# Patient Record
Sex: Female | Born: 1950 | Race: Black or African American | Hispanic: No | Marital: Single | Smoking: Former smoker
Health system: Southern US, Community
[De-identification: ages and names within clinical notes are randomized; demographics above are authoritative.]

## PROBLEM LIST (undated history)

## (undated) DIAGNOSIS — E7800 Pure hypercholesterolemia, unspecified: Secondary | ICD-10-CM

## (undated) DIAGNOSIS — I10 Essential (primary) hypertension: Secondary | ICD-10-CM

## (undated) DIAGNOSIS — G43909 Migraine, unspecified, not intractable, without status migrainosus: Secondary | ICD-10-CM

## (undated) DIAGNOSIS — M1990 Unspecified osteoarthritis, unspecified site: Secondary | ICD-10-CM

---

## 2006-12-14 ENCOUNTER — Emergency Department: Payer: Self-pay | Admitting: Internal Medicine

## 2008-01-23 ENCOUNTER — Emergency Department: Payer: Self-pay | Admitting: Emergency Medicine

## 2008-05-28 ENCOUNTER — Ambulatory Visit: Payer: Self-pay | Admitting: Family Medicine

## 2008-07-18 ENCOUNTER — Emergency Department: Payer: Self-pay | Admitting: Emergency Medicine

## 2008-08-09 ENCOUNTER — Encounter: Payer: Self-pay | Admitting: Family Medicine

## 2008-08-29 ENCOUNTER — Encounter: Payer: Self-pay | Admitting: Family Medicine

## 2008-09-28 ENCOUNTER — Encounter: Payer: Self-pay | Admitting: Family Medicine

## 2009-11-18 ENCOUNTER — Ambulatory Visit: Payer: Self-pay | Admitting: Unknown Physician Specialty

## 2010-01-04 ENCOUNTER — Emergency Department: Payer: Self-pay | Admitting: Emergency Medicine

## 2011-07-05 ENCOUNTER — Emergency Department: Payer: Self-pay | Admitting: Internal Medicine

## 2011-07-27 ENCOUNTER — Ambulatory Visit: Payer: Self-pay

## 2012-07-01 ENCOUNTER — Ambulatory Visit: Payer: Self-pay | Admitting: Internal Medicine

## 2013-12-05 ENCOUNTER — Emergency Department: Payer: Self-pay | Admitting: Emergency Medicine

## 2017-05-19 ENCOUNTER — Emergency Department
Admission: EM | Admit: 2017-05-19 | Discharge: 2017-05-19 | Disposition: A | Discharge status: Home / Self Care | Payer: Managed Care, Other (non HMO) | Attending: Emergency Medicine | Admitting: Emergency Medicine

## 2017-05-19 ENCOUNTER — Encounter: Payer: Self-pay | Admitting: Emergency Medicine

## 2017-05-19 ENCOUNTER — Emergency Department: Payer: Managed Care, Other (non HMO)

## 2017-05-19 DIAGNOSIS — Z79899 Other long term (current) drug therapy: Secondary | ICD-10-CM | POA: Diagnosis not present

## 2017-05-19 DIAGNOSIS — R531 Weakness: Secondary | ICD-10-CM | POA: Diagnosis not present

## 2017-05-19 DIAGNOSIS — Z87891 Personal history of nicotine dependence: Secondary | ICD-10-CM | POA: Insufficient documentation

## 2017-05-19 DIAGNOSIS — R42 Dizziness and giddiness: Secondary | ICD-10-CM | POA: Diagnosis present

## 2017-05-19 DIAGNOSIS — I10 Essential (primary) hypertension: Secondary | ICD-10-CM | POA: Insufficient documentation

## 2017-05-19 HISTORY — DX: Unspecified osteoarthritis, unspecified site: M19.90

## 2017-05-19 HISTORY — DX: Essential (primary) hypertension: I10

## 2017-05-19 HISTORY — DX: Migraine, unspecified, not intractable, without status migrainosus: G43.909

## 2017-05-19 HISTORY — DX: Pure hypercholesterolemia, unspecified: E78.00

## 2017-05-19 LAB — URINALYSIS, COMPLETE (UACMP) WITH MICROSCOPIC
BILIRUBIN URINE: NEGATIVE
Glucose, UA: NEGATIVE mg/dL
Hgb urine dipstick: NEGATIVE
KETONES UR: NEGATIVE mg/dL
LEUKOCYTES UA: NEGATIVE
Nitrite: NEGATIVE
PROTEIN: NEGATIVE mg/dL
RBC / HPF: NONE SEEN RBC/hpf (ref 0–5)
Specific Gravity, Urine: 1.002 — ABNORMAL LOW (ref 1.005–1.030)
pH: 7 (ref 5.0–8.0)

## 2017-05-19 LAB — CBC
HEMATOCRIT: 38 % (ref 35.0–47.0)
HEMOGLOBIN: 13.1 g/dL (ref 12.0–16.0)
MCH: 27 pg (ref 26.0–34.0)
MCHC: 34.4 g/dL (ref 32.0–36.0)
MCV: 78.3 fL — AB (ref 80.0–100.0)
Platelets: 189 10*3/uL (ref 150–440)
RBC: 4.85 MIL/uL (ref 3.80–5.20)
RDW: 14.1 % (ref 11.5–14.5)
WBC: 6 10*3/uL (ref 3.6–11.0)

## 2017-05-19 LAB — BASIC METABOLIC PANEL
ANION GAP: 7 (ref 5–15)
BUN: 16 mg/dL (ref 6–20)
CHLORIDE: 102 mmol/L (ref 101–111)
CO2: 26 mmol/L (ref 22–32)
Calcium: 9.2 mg/dL (ref 8.9–10.3)
Creatinine, Ser: 0.82 mg/dL (ref 0.44–1.00)
GFR calc non Af Amer: >60 mL/min (ref >60)
Glucose, Bld: 129 mg/dL — ABNORMAL HIGH (ref 65–99)
POTASSIUM: 4.3 mmol/L (ref 3.5–5.1)
SODIUM: 135 mmol/L (ref 135–145)

## 2017-05-19 LAB — TROPONIN I: Troponin I: <0.03 ng/mL (ref <0.03)

## 2017-05-19 NOTE — ED Provider Notes (Signed)
[facility] Emergency Department Provider Note ____________________________________________   I have reviewed the triage vital signs and the triage nursing note.  HISTORY  Chief Complaint Weakness and Dizziness   Historian Patient  HPI Jacqueline Dalton is a 66 y.o. female with a history of hypertension, hyperlipidemia, and migraines, works as a Lawyer, states that on Friday after work she had onset of dizziness which she describes as lightheadedness and states that during the day she had been sweating while she was working although she was not out in the heat, she felt like she may have gotten overheated and dehydrated. She had a period about 10 minutes where she was feeling really lightheaded and felt like she was sweating and took it easy that evening.  Yesterday, Saturday, she felt like she just didn't have a much energy throughout the day.  This morning she was getting ready for work and started to feel like she might be a little lightheaded again. She states that she drank a lot of water and came over here for evaluation.  No shortness breath or trouble breathing. No fever. No abdominal pain. Mild nausea without vomiting. No diarrhea. No focal weakness or numbness. No slurred speech. No trouble word finding. No room spinning.    Past Medical History:  Diagnosis Date  . Arthritis   . Hypercholesteremia   . Hypertension   . Migraine     There are no active problems to display for this patient.   History reviewed. No pertinent surgical history.  Prior to Admission medications   Medication Sig Start Date End Date Taking? Authorizing Provider  amLODipine (NORVASC) 5 MG tablet Take 5 mg by mouth daily. 04/18/17  Yes [provider]  atorvastatin (LIPITOR) 20 MG tablet Take 20 mg by mouth daily. 05/06/17  Yes [provider]  butalbital-acetaminophen-caffeine (FIORICET, ESGIC) 50-325-40 MG tablet Take 1-2 tablets by mouth every 6 (six) hours as  needed for headache. 04/22/17  Yes [provider]  ibuprofen (ADVIL,MOTRIN) 800 MG tablet Take 800 mg by mouth every 6 (six) hours as needed for pain. Take 800 mg by mouth every 6-8 hours as needed for arthritis. 03/23/17  Yes [provider]  lovastatin (MEVACOR) 20 MG tablet Take 20 mg by mouth daily. 04/09/17  Yes [provider]  nabumetone (RELAFEN) 750 MG tablet Take 750 mg by mouth 2 (two) times daily. 05/13/17  Yes [provider]  omeprazole (PRILOSEC) 20 MG capsule Take 20 mg by mouth daily. 03/23/17  Yes [provider]  traMADol (ULTRAM) 50 MG tablet Take 50 mg by mouth every 6 (six) hours as needed for pain. 02/25/17  Yes [provider]    No Known Allergies  History reviewed. No pertinent family history.  Social History Social History  Substance Use Topics  . Smoking status: Former Games developer  . Smokeless tobacco: Never Used  . Alcohol use No    Review of Systems  Constitutional: Negative for fever. Eyes: Negative for visual changes. ENT: Negative for sore throat. Cardiovascular: Negative for chest pain. Respiratory: Negative for shortness of breath. Gastrointestinal: Negative for abdominal pain, vomiting and diarrhea. Genitourinary: Negative for dysuria. Musculoskeletal: Negative for back pain. Skin: Negative for rash. Neurological: Negative for headache.  ____________________________________________   PHYSICAL EXAM:  VITAL SIGNS: ED Triage Vitals  Enc Vitals Group     BP 05/19/17 0742 (!) 162/90     Pulse Rate 05/19/17 0742 92     Resp 05/19/17 0742 18  Temp 05/19/17 0742 98.4 F (36.9 C)     Temp Source 05/19/17 0742 Oral     SpO2 05/19/17 0742 98 %     Weight 05/19/17 0742 204 lb (92.5 kg)     Height 05/19/17 0742 5\' 4"  (1.626 m)     Head Circumference --      Peak Flow --      Pain Score 05/19/17 0745 3     Pain Loc --      Pain Edu? --      Excl. in GC? --      Constitutional: Alert and  oriented. Well appearing and in no distress. HEENT   Head: Normocephalic and atraumatic.      Eyes: Conjunctivae are normal. Pupils equal and round.       Ears:         Nose: No congestion/rhinnorhea.   Mouth/Throat: Mucous membranes are moist.   Neck: No stridor. Cardiovascular/Chest: Normal rate, regular rhythm.  No murmurs, rubs, or gallops. Respiratory: Normal respiratory effort without tachypnea nor retractions. Breath sounds are clear and equal bilaterally. No wheezes/rales/rhonchi. Gastrointestinal: Soft. No distention, no guarding, no rebound. Nontender.    Genitourinary/rectal:Deferred Musculoskeletal: Nontender with normal range of motion in all extremities. No joint effusions.  No lower extremity tenderness.  No edema. Neurologic:  No facial droop. No slurred speech.Normal speech and language. No gross or focal neurologic deficits are appreciated.  5 strength in 4 extremities. Coordination intact. Skin:  Skin is warm, dry and intact. No rash noted. Psychiatric: Mood and affect are normal. Speech and behavior are normal. Patient exhibits appropriate insight and judgment.   ____________________________________________  LABS (pertinent positives/negatives)  Labs Reviewed  BASIC METABOLIC PANEL - Abnormal; Notable for the following:       Result Value   Glucose, Bld 129 (*)    All other components within normal limits  CBC - Abnormal; Notable for the following:    MCV 78.3 (*)    All other components within normal limits  URINALYSIS, COMPLETE (UACMP) WITH MICROSCOPIC - Abnormal; Notable for the following:    Color, Urine COLORLESS (*)    APPearance CLEAR (*)    Specific Gravity, Urine 1.002 (*)    Bacteria, UA RARE (*)    Squamous Epithelial / LPF 0-5 (*)    All other components within normal limits  TROPONIN I    ____________________________________________    EKG I, Governor Rooksebecca Brelyn Woehl, MD, the attending physician have personally viewed and interpreted all  ECGs.  83 bpm. Normal sinus rhythm. Narrow QRS. Normal axis. Normal ST and T-wave ____________________________________________  RADIOLOGY All Xrays were viewed by me. Imaging interpreted by Radiologist.  CT head without contrast:  IMPRESSION: No acute intracranial findings. __________________________________________  PROCEDURES  Procedure(s) performed: None  Critical Care performed: None  ____________________________________________   ED COURSE / ASSESSMENT AND PLAN  Pertinent labs & imaging results that were available during my care of the patient were reviewed by me and considered in my medical decision making (see chart for details).   Jacqueline Dalton, the complaint of an episode of dizziness associated with some sweating, possibly exertional on Friday, followed by 24 hours of significant fatigue, but without any focal neurologic deficit, and then this morning felt like she might have persistent lightheaded/dizziness.  Currently she is overall well-appearing. She is not reporting focal neurologic symptoms and I think TIA or stroke seems unlikely. However given the fact that she has not had episodes like this with dizziness, I am going  to obtain head CT. Symptom onset at this point was to half days ago.  Denies any chest pain or shortness of breath, I think although she had exertional sweating, that is not ongoing, and her EKG is reassuring, I'm less suspicious for ACS.  However given some exertional component, and persistent fatigue, I am going to recommend she follows up with cardiology.    CONSULTATIONS:   none  Patient / Family / Caregiver informed of clinical course, medical decision-making process, and agree with plan.   I discussed return precautions, follow-up instructions, and discharge instructions with patient and/or family.  Discharge Instructions :  You were evaluated for dizziness and fatigue, and although no certain cause was found, your exam and evaluation are  overall reassuring in the emergency room today.  Return to the emergency room immediately for any worsening symptoms including weakness, numbness, slurred speech, trouble finding her words, dizziness, passing out, altered mental status, or any other symptoms concerning to you.  ___________________________________________   FINAL CLINICAL IMPRESSION(S) / ED DIAGNOSES   Final diagnoses:  Dizziness  Generalized weakness  Lightheadedness              Note: This dictation was prepared with Dragon dictation. Any transcriptional errors that result from this process are unintentional    Governor Rooks, MD 05/19/17 1242

## 2017-05-19 NOTE — ED Triage Notes (Signed)
Pt states Friday night after she got off work she was dizzy while getting in to the shower. Pt states she had chest tightness and weakness in her legs on Friday.  Pt felt the same sxs yesterday but states it wasn't as bad as before. Pt states she has been having intermittent headaches for the past several weeks. Pt states this morning she was getting ready for work and felt lightheaded again.  Pt is ambulatory in triage without difficulty.

## 2017-05-19 NOTE — Discharge Instructions (Signed)
You were evaluated for dizziness and fatigue, and although no certain cause was found, your exam and evaluation are overall reassuring in the emergency room today.  Return to the emergency room immediately for any worsening symptoms including weakness, numbness, slurred speech, trouble finding her words, dizziness, passing out, altered mental status, or any other symptoms concerning to you.

## 2017-05-23 DIAGNOSIS — I10 Essential (primary) hypertension: Secondary | ICD-10-CM | POA: Insufficient documentation

## 2017-05-23 DIAGNOSIS — E7800 Pure hypercholesterolemia, unspecified: Secondary | ICD-10-CM | POA: Insufficient documentation

## 2017-09-27 ENCOUNTER — Emergency Department
Admission: EM | Admit: 2017-09-27 | Discharge: 2017-09-27 | Disposition: A | Discharge status: Home / Self Care | Payer: Medicaid Other | Attending: Emergency Medicine | Admitting: Emergency Medicine

## 2017-09-27 ENCOUNTER — Encounter: Payer: Self-pay | Admitting: Emergency Medicine

## 2017-09-27 ENCOUNTER — Emergency Department: Payer: Medicaid Other

## 2017-09-27 DIAGNOSIS — S9032XA Contusion of left foot, initial encounter: Secondary | ICD-10-CM | POA: Diagnosis not present

## 2017-09-27 DIAGNOSIS — Z79899 Other long term (current) drug therapy: Secondary | ICD-10-CM | POA: Insufficient documentation

## 2017-09-27 DIAGNOSIS — Z87891 Personal history of nicotine dependence: Secondary | ICD-10-CM | POA: Insufficient documentation

## 2017-09-27 DIAGNOSIS — Y999 Unspecified external cause status: Secondary | ICD-10-CM | POA: Diagnosis not present

## 2017-09-27 DIAGNOSIS — I10 Essential (primary) hypertension: Secondary | ICD-10-CM | POA: Insufficient documentation

## 2017-09-27 DIAGNOSIS — W208XXA Other cause of strike by thrown, projected or falling object, initial encounter: Secondary | ICD-10-CM | POA: Insufficient documentation

## 2017-09-27 DIAGNOSIS — Y929 Unspecified place or not applicable: Secondary | ICD-10-CM | POA: Diagnosis not present

## 2017-09-27 DIAGNOSIS — S99922A Unspecified injury of left foot, initial encounter: Secondary | ICD-10-CM | POA: Diagnosis present

## 2017-09-27 DIAGNOSIS — Y939 Activity, unspecified: Secondary | ICD-10-CM | POA: Diagnosis not present

## 2017-09-27 LAB — BASIC METABOLIC PANEL
ANION GAP: 10 (ref 5–15)
BUN: 15 mg/dL (ref 6–20)
CO2: 23 mmol/L (ref 22–32)
Calcium: 9.5 mg/dL (ref 8.9–10.3)
Chloride: 104 mmol/L (ref 101–111)
Creatinine, Ser: 0.78 mg/dL (ref 0.44–1.00)
GFR calc Af Amer: >60 mL/min (ref >60)
Glucose, Bld: 146 mg/dL — ABNORMAL HIGH (ref 65–99)
POTASSIUM: 4.3 mmol/L (ref 3.5–5.1)
SODIUM: 137 mmol/L (ref 135–145)

## 2017-09-27 LAB — CBC WITH DIFFERENTIAL/PLATELET
BASOS ABS: 0.1 10*3/uL (ref 0–0.1)
Basophils Relative: 2 %
EOS ABS: 0.2 10*3/uL (ref 0–0.7)
EOS PCT: 3 %
HCT: 38.4 % (ref 35.0–47.0)
Hemoglobin: 13.1 g/dL (ref 12.0–16.0)
LYMPHS PCT: 41 %
Lymphs Abs: 2.5 10*3/uL (ref 1.0–3.6)
MCH: 26.8 pg (ref 26.0–34.0)
MCHC: 34.1 g/dL (ref 32.0–36.0)
MCV: 78.6 fL — ABNORMAL LOW (ref 80.0–100.0)
Monocytes Absolute: 0.5 10*3/uL (ref 0.2–0.9)
Monocytes Relative: 8 %
Neutro Abs: 2.9 10*3/uL (ref 1.4–6.5)
Neutrophils Relative %: 46 %
PLATELETS: 207 10*3/uL (ref 150–440)
RBC: 4.88 MIL/uL (ref 3.80–5.20)
RDW: 13.6 % (ref 11.5–14.5)
WBC: 6.2 10*3/uL (ref 3.6–11.0)

## 2017-09-27 LAB — URIC ACID: URIC ACID, SERUM: 5.8 mg/dL (ref 2.3–6.6)

## 2017-09-27 MED ORDER — NAPROXEN 500 MG PO TABS: 500.0000 mg | ORAL_TABLET | Freq: Two times a day (BID) | ORAL | Status: AC

## 2017-09-27 MED ORDER — NAPROXEN 500 MG PO TABS
500.0000 mg | ORAL_TABLET | Freq: Once | ORAL | Status: AC
  Administered 2017-09-27: 500 mg via ORAL
  Filled 2017-09-27: qty 1

## 2017-09-27 NOTE — Discharge Instructions (Addendum)
Wear ace wrap and open shoe for 7-10 days.

## 2017-09-27 NOTE — ED Provider Notes (Signed)
[facility] Emergency Department Provider Note   ____________________________________________   First MD Initiated Contact with Patient 09/27/17 1022     (approximate)  I have reviewed the triage vital signs and the nursing notes.   HISTORY  Chief Complaint Foot Pain    HPI Jacqueline Dalton is a 66 y.o. female patient complaining the left foot pain and edema since 07/29/2017. Patient states she dropped a glass bottle on her foot and has not improved since that incident. Patient state taking over-the-counter Tylenol and ibuprofen.Patient rates the pain as a 6/10. Patient described a pain as "achy". Patient  able to bear weight.   Past Medical History:  Diagnosis Date  . Arthritis   . Hypercholesteremia   . Hypertension   . Migraine     There are no active problems to display for this patient.   History reviewed. No pertinent surgical history.  Prior to Admission medications   Medication Sig Start Date End Date Taking? Authorizing Provider  amLODipine (NORVASC) 5 MG tablet Take 5 mg by mouth daily. 04/18/17   [provider]  atorvastatin (LIPITOR) 20 MG tablet Take 20 mg by mouth daily. 05/06/17   [provider]  butalbital-acetaminophen-caffeine (FIORICET, ESGIC) 50-325-40 MG tablet Take 1-2 tablets by mouth every 6 (six) hours as needed for headache. 04/22/17   [provider]  ibuprofen (ADVIL,MOTRIN) 800 MG tablet Take 800 mg by mouth every 6 (six) hours as needed for pain. Take 800 mg by mouth every 6-8 hours as needed for arthritis. 03/23/17   [provider]  lovastatin (MEVACOR) 20 MG tablet Take 20 mg by mouth daily. 04/09/17   [provider]  nabumetone (RELAFEN) 750 MG tablet Take 750 mg by mouth 2 (two) times daily. 05/13/17   [provider]  naproxen (NAPROSYN) 500 MG tablet Take 1 tablet (500 mg total) by mouth 2 (two) times daily with a meal. 09/27/17   Joni ReiningSmith, Ronald K, PA-C  omeprazole  (PRILOSEC) 20 MG capsule Take 20 mg by mouth daily. 03/23/17   [provider]  traMADol (ULTRAM) 50 MG tablet Take 50 mg by mouth every 6 (six) hours as needed for pain. 02/25/17   [provider]    Allergies Patient has no known allergies.  No family history on file.  Social History Social History   Tobacco Use  . Smoking status: Former Games developermoker  . Smokeless tobacco: Never Used  Substance Use Topics  . Alcohol use: No  . Drug use: Not on file    Review of Systems Constitutional: No fever/chills Eyes: No visual changes. ENT: No sore throat. Cardiovascular: Denies chest pain. Respiratory: Denies shortness of breath. Gastrointestinal: No abdominal pain.  No nausea, no vomiting.  No diarrhea.  No constipation. Genitourinary: Negative for dysuria. Musculoskeletal: Negative for back pain. Skin: Negative for rash. Neurological: Negative for headaches, focal weakness or numbness. Endocrine:Hyperlipidemia, hypertension, prediabetic.  ____________________________________________   PHYSICAL EXAM:  VITAL SIGNS: ED Triage Vitals  Enc Vitals Group     BP 09/27/17 0947 (!) 168/91     Pulse Rate 09/27/17 0947 87     Resp 09/27/17 0947 16     Temp 09/27/17 0947 98.3 F (36.8 C)     Temp Source 09/27/17 0947 Oral     SpO2 09/27/17 0947 98 %     Weight 09/27/17 0941 200 lb (90.7 kg)     Height 09/27/17 0941 5\' 2"  (1.575 m)     Head Circumference --  Peak Flow --      Pain Score 09/27/17 0940 6     Pain Loc --      Pain Edu? --      Excl. in GC? --     Constitutional: Alert and oriented. Well appearing and in no acute distress. Hematological/Lymphatic/Immunilogical: No cervical lymphadenopathy. Cardiovascular: Normal rate, regular rhythm. Grossly normal heart sounds.  Good peripheral circulation. Respiratory: Normal respiratory effort.  No retractions. Lungs CTAB. Gastrointestinal: Soft and nontender. No distention. No abdominal bruits. No CVA  tenderness. Musculoskeletal: No lower extremity tenderness nor edema.  No joint effusions. Neurologic:  Normal speech and language. No gross focal neurologic deficits are appreciated. No gait instability. Skin:  Skin is warm, dry and intact. No rash noted. Psychiatric: Mood and affect are normal. Speech and behavior are normal.  ____________________________________________   LABS (all labs ordered are listed, but only abnormal results are displayed)  Labs Reviewed  CBC WITH DIFFERENTIAL/PLATELET - Abnormal; Notable for the following components:      Result Value   MCV 78.6 (*)    All other components within normal limits  BASIC METABOLIC PANEL - Abnormal; Notable for the following components:   Glucose, Bld 146 (*)    All other components within normal limits  URIC ACID   ____________________________________________  EKG  ____________________________________________  RADIOLOGY  Dg Foot Complete Left  Result Date: 09/27/2017 CLINICAL DATA:  Left foot pain on dorsal surface since dropping glass bottle on foot x2 months ago. Hx of arthritis. No previous surgery to left foot. EXAM: LEFT FOOT - COMPLETE 3+ VIEW COMPARISON:  None. FINDINGS: There is no evidence of fracture or dislocation. There is no evidence of arthropathy or other focal bone abnormality. Soft tissue swelling along the dorsal forefoot. IMPRESSION: No acute osseous injury of the left foot. Electronically Signed   By: Elige KoHetal  Patel   On: 09/27/2017 10:13    ____________________________________________   PROCEDURES  Procedure(s) performed: None  Procedures  Critical Care performed: No  ____________________________________________   INITIAL IMPRESSION / ASSESSMENT AND PLAN / ED COURSE  As part of my medical decision making, I reviewed the following data within the electronic MEDICAL RECORD NUMBER    (Pain secondary to contusion. Discussed negative labs and x-ray results with patient. Patient given discharge  Instruction. Patient put was a graft and she was given open shoe. Patient advised follow-up with PCP in one week for reevaluation.      ____________________________________________   FINAL CLINICAL IMPRESSION(S) / ED DIAGNOSES  Final diagnoses:  Contusion of left foot, initial encounter     ED Discharge Orders        Ordered    naproxen (NAPROSYN) 500 MG tablet  2 times daily with meals     09/27/17 1121       Note:  This document was prepared using Dragon voice recognition software and may include unintentional dictation errors.    Joni ReiningSmith, Ronald K, PA-C 09/27/17 1124    Sharyn CreamerQuale, Mark, MD 09/27/17 [phone]

## 2017-09-27 NOTE — ED Triage Notes (Signed)
Patient presents to the ED with left foot pain since Oct. 1st.  Patient states pain is to the top of her foot and began when she accidentally dropped a glass bottle on it.  Patient states, "I've been waiting for it to get better, but it hasn't."

## 2017-10-28 ENCOUNTER — Emergency Department
Admission: EM | Admit: 2017-10-28 | Discharge: 2017-10-28 | Disposition: A | Discharge status: Home / Self Care | Payer: Medicaid Other | Attending: Student in an Organized Health Care Education/Training Program | Admitting: Student in an Organized Health Care Education/Training Program

## 2017-10-28 ENCOUNTER — Encounter: Payer: Self-pay | Admitting: Emergency Medicine

## 2017-10-28 ENCOUNTER — Other Ambulatory Visit: Payer: Self-pay

## 2017-10-28 ENCOUNTER — Emergency Department: Payer: Medicaid Other

## 2017-10-28 DIAGNOSIS — Z87891 Personal history of nicotine dependence: Secondary | ICD-10-CM | POA: Diagnosis not present

## 2017-10-28 DIAGNOSIS — Z79899 Other long term (current) drug therapy: Secondary | ICD-10-CM | POA: Insufficient documentation

## 2017-10-28 DIAGNOSIS — Z791 Long term (current) use of non-steroidal anti-inflammatories (NSAID): Secondary | ICD-10-CM | POA: Diagnosis not present

## 2017-10-28 DIAGNOSIS — R0981 Nasal congestion: Secondary | ICD-10-CM | POA: Diagnosis not present

## 2017-10-28 DIAGNOSIS — I10 Essential (primary) hypertension: Secondary | ICD-10-CM | POA: Diagnosis not present

## 2017-10-28 DIAGNOSIS — R05 Cough: Secondary | ICD-10-CM | POA: Diagnosis present

## 2017-10-28 DIAGNOSIS — J40 Bronchitis, not specified as acute or chronic: Secondary | ICD-10-CM | POA: Diagnosis not present

## 2017-10-28 LAB — COMPREHENSIVE METABOLIC PANEL
ALBUMIN: 4.1 g/dL (ref 3.5–5.0)
ALT: 40 U/L (ref 14–54)
AST: 39 U/L (ref 15–41)
Alkaline Phosphatase: 175 U/L — ABNORMAL HIGH (ref 38–126)
Anion gap: 11 (ref 5–15)
BUN: 13 mg/dL (ref 6–20)
CHLORIDE: 101 mmol/L (ref 101–111)
CO2: 25 mmol/L (ref 22–32)
CREATININE: 0.75 mg/dL (ref 0.44–1.00)
Calcium: 9.5 mg/dL (ref 8.9–10.3)
GFR calc Af Amer: >60 mL/min (ref >60)
GFR calc non Af Amer: >60 mL/min (ref >60)
GLUCOSE: 122 mg/dL — AB (ref 65–99)
POTASSIUM: 4 mmol/L (ref 3.5–5.1)
SODIUM: 137 mmol/L (ref 135–145)
Total Bilirubin: 0.6 mg/dL (ref 0.3–1.2)
Total Protein: 7.6 g/dL (ref 6.5–8.1)

## 2017-10-28 LAB — CBC WITH DIFFERENTIAL/PLATELET
Basophils Absolute: 0.1 10*3/uL (ref 0–0.1)
Basophils Relative: 1 %
EOS ABS: 0.1 10*3/uL (ref 0–0.7)
EOS PCT: 2 %
HCT: 38 % (ref 35.0–47.0)
Hemoglobin: 13.1 g/dL (ref 12.0–16.0)
LYMPHS ABS: 0.8 10*3/uL — AB (ref 1.0–3.6)
LYMPHS PCT: 10 %
MCH: 26.9 pg (ref 26.0–34.0)
MCHC: 34.5 g/dL (ref 32.0–36.0)
MCV: 78 fL — AB (ref 80.0–100.0)
MONOS PCT: 8 %
Monocytes Absolute: 0.7 10*3/uL (ref 0.2–0.9)
Neutro Abs: 6.6 10*3/uL — ABNORMAL HIGH (ref 1.4–6.5)
Neutrophils Relative %: 79 %
PLATELETS: 185 10*3/uL (ref 150–440)
RBC: 4.87 MIL/uL (ref 3.80–5.20)
RDW: 13.6 % (ref 11.5–14.5)
WBC: 8.3 10*3/uL (ref 3.6–11.0)

## 2017-10-28 LAB — TROPONIN I

## 2017-10-28 MED ORDER — SULFAMETHOXAZOLE-TRIMETHOPRIM 800-160 MG PO TABS: 1.0000 | ORAL_TABLET | Freq: Two times a day (BID) | ORAL | 0 refills | Status: AC

## 2017-10-28 MED ORDER — FEXOFENADINE-PSEUDOEPHED ER 60-120 MG PO TB12: 1.0000 | ORAL_TABLET | Freq: Two times a day (BID) | ORAL | 0 refills | Status: AC

## 2017-10-28 MED ORDER — BENZONATATE 100 MG PO CAPS
200.0000 mg | ORAL_CAPSULE | Freq: Three times a day (TID) | ORAL | 0 refills | Status: AC | PRN
Start: 2017-10-28 — End: 2018-10-28

## 2017-10-28 NOTE — ED Provider Notes (Signed)
[facility] Emergency Department Provider Note   ____________________________________________   First MD Initiated Contact with Patient 10/28/17 1220     (approximate)  I have reviewed the triage vital signs and the nursing notes.   HISTORY  Chief Complaint Cough and Generalized Body Aches    HPI Jacqueline Dalton is a 66 y.o. female patient presents with complaining of runny nose for 2 weeks. States the last 2 days onset of  cough and today she is having generalized body aches. Patient denies fever. Patient denies shortness of breath. Patient states chest wall pain which increased with cough. Patient has taken a flu immunizations this season. Patient was found to be tachycardic in triage.No palliative measures for complaint.   Past Medical History:  Diagnosis Date  . Arthritis   . Hypercholesteremia   . Hypertension   . Migraine     There are no active problems to display for this patient.   History reviewed. No pertinent surgical history.  Prior to Admission medications   Medication Sig Start Date End Date Taking? Authorizing Provider  amLODipine (NORVASC) 5 MG tablet Take 5 mg by mouth daily. 04/18/17   [provider]  atorvastatin (LIPITOR) 20 MG tablet Take 20 mg by mouth daily. 05/06/17   [provider]  benzonatate (TESSALON PERLES) 100 MG capsule Take 2 capsules (200 mg total) by mouth 3 (three) times daily as needed. 10/28/17 10/28/18  Joni Reining, PA-C  butalbital-acetaminophen-caffeine (FIORICET, ESGIC) [phone] MG tablet Take 1-2 tablets by mouth every 6 (six) hours as needed for headache. 04/22/17   [provider]  fexofenadine-pseudoephedrine (ALLEGRA-D) 60-120 MG 12 hr tablet Take 1 tablet by mouth 2 (two) times daily. 10/28/17   Joni Reining, PA-C  ibuprofen (ADVIL,MOTRIN) 800 MG tablet Take 800 mg by mouth every 6 (six) hours as needed for pain. Take 800 mg by mouth every 6-8 hours as needed for  arthritis. 03/23/17   [provider]  lovastatin (MEVACOR) 20 MG tablet Take 20 mg by mouth daily. 04/09/17   [provider]  nabumetone (RELAFEN) 750 MG tablet Take 750 mg by mouth 2 (two) times daily. 05/13/17   [provider]  naproxen (NAPROSYN) 500 MG tablet Take 1 tablet (500 mg total) by mouth 2 (two) times daily with a meal. 09/27/17   Joni Reining, PA-C  omeprazole (PRILOSEC) 20 MG capsule Take 20 mg by mouth daily. 03/23/17   [provider]  sulfamethoxazole-trimethoprim (BACTRIM DS,SEPTRA DS) 800-160 MG tablet Take 1 tablet by mouth 2 (two) times daily. 10/28/17   Joni Reining, PA-C  traMADol (ULTRAM) 50 MG tablet Take 50 mg by mouth every 6 (six) hours as needed for pain. 02/25/17   [provider]    Allergies Patient has no known allergies.  History reviewed. No pertinent family history.  Social History Social History   Tobacco Use  . Smoking status: Former Games developer  . Smokeless tobacco: Never Used  Substance Use Topics  . Alcohol use: No  . Drug use: Not on file    Review of Systems Constitutional: No fever/chills Eyes: No visual changes. ENT: Intermittent nasal congestion and runny nose Cardiovascular: Denies chest pain. Respiratory: Denies shortness of breath. Nonproductive cough Gastrointestinal: No abdominal pain.  No nausea, no vomiting.  No diarrhea.  No constipation. Genitourinary: Negative for dysuria. Musculoskeletal: Anterior chest wall pain Skin: Negative for rash. Neurological: Negative for headaches, focal weakness or numbness.   ____________________________________________   PHYSICAL EXAM:  VITAL SIGNS: ED Triage Vitals  Enc Vitals Group     BP 10/28/17 1158 (!) 148/114     Pulse Rate 10/28/17 1158 (!) 120     Resp 10/28/17 1158 18     Temp 10/28/17 1158 99.4 F (37.4 C)     Temp Source 10/28/17 1158 Oral     SpO2 10/28/17 1158 97 %     Weight 10/28/17 1200 209 lb (94.8 kg)     Height  10/28/17 1200 5\' 3"  (1.6 m)     Head Circumference --      Peak Flow --      Pain Score 10/28/17 1205 10     Pain Loc --      Pain Edu? --      Excl. in GC? --    Constitutional: Alert and oriented. Well appearing and in no acute distress. Nose: Clear rhinorrhea  Mouth/Throat: Mucous membranes are moist.  Oropharynx non-erythematous. Neck: No stridor.  No cervical lymphadenopathy. Cardiovascular: Tachycardic, regular rhythm. Grossly normal heart sounds.  Good peripheral circulation. Respiratory: Normal respiratory effort.  No retractions. Lungs CTAB. Neurologic:  Normal speech and language. No gross focal neurologic deficits are appreciated. No gait instability. Skin:  Skin is warm, dry and intact. No rash noted. Psychiatric: Mood and affect are normal. Speech and behavior are normal.  ____________________________________________   LABS (all labs ordered are listed, but only abnormal results are displayed)  Labs Reviewed  COMPREHENSIVE METABOLIC PANEL - Abnormal; Notable for the following components:      Result Value   Glucose, Bld 122 (*)    Alkaline Phosphatase 175 (*)    All other components within normal limits  CBC WITH DIFFERENTIAL/PLATELET - Abnormal; Notable for the following components:   MCV 78.0 (*)    Neutro Abs 6.6 (*)    Lymphs Abs 0.8 (*)    All other components within normal limits  TROPONIN I   ____________________________________________  EKG   ____________________________________________  RADIOLOGY  Dg Chest 2 View  Result Date: 10/28/2017 CLINICAL DATA:  Mid chest pain and body aches began this morning. History of hypertension, hyperlipidemia, former smoker. EXAM: CHEST  2 VIEW COMPARISON:  None in PACs FINDINGS: The lungs are adequately inflated. The interstitial markings are coarse. There is no alveolar infiltrate or pleural effusion. The heart and pulmonary vascularity are normal. The mediastinum is normal in width. The bony thorax exhibits no  acute abnormality. IMPRESSION: Mild interstitial prominence of both lungs may be chronic and related to previous smoking but could also reflect acute bronchitic change. No alveolar pneumonia nor CHF. Electronically Signed   By: David  Swazil[place]Jordan M.D.   On: 10/28/2017 12:43    ____________________________________________   PROCEDURES  Procedure(s) performed: None  Procedures  Critical Care performed: No  ____________________________________________   INITIAL IMPRESSION / ASSESSMENT AND PLAN / ED COURSE  As part of my medical decision making, I reviewed the following data within the electronic MEDICAL RECORD NUMBER    Cough secondary to bronchitis. Discuss x-ray and lab results patient. Patient given discharge Instructions advised to take medications as directed. Patient advised follow-up PCP if condition persists. Patient given a work note for today.      ____________________________________________   FINAL CLINICAL IMPRESSION(S) / ED DIAGNOSES  Final diagnoses:  Bronchitis  Nasal congestion     ED Discharge Orders        Ordered    fexofenadine-pseudoephedrine (ALLEGRA-D) 60-120 MG 12 hr tablet  2 times daily  10/28/17 1259    sulfamethoxazole-trimethoprim (BACTRIM DS,SEPTRA DS) 800-160 MG tablet  2 times daily     10/28/17 1259    benzonatate (TESSALON PERLES) 100 MG capsule  3 times daily PRN     10/28/17 1259       Note:  This document was prepared using Dragon voice recognition software and may include unintentional dictation errors.    Joni ReiningSmith, Mckenze Slone K, PA-C 10/28/17 1302    Willy Eddyobinson, Patrick, MD 10/28/17 [phone]

## 2017-10-28 NOTE — ED Notes (Signed)
Pt c/o runny nose x 2 weeks, states yesterday began having generalized body aches, chest pain with cough. Pt denies SHOB at this time, pt denies productive cough at this time.

## 2017-10-28 NOTE — ED Triage Notes (Signed)
Started with runny nose X 2 weeks and 2 days ago began with cough and generalized body aches. No fevers. Denies SHOB. Chest wall pain with cough only.  Ambulatory. No respiratory distress. ST in triage, EKG done for this.

## 2018-01-29 IMAGING — CT CT HEAD W/O CM
3 series · 15 of 44 positions shown, 18 images · non-contrast
Comparison: None.

CLINICAL DATA: Episode of dizziness with chest tightness and
weakness 2 days ago in again yesterday. Headaches past several
weeks.

EXAM:
CT HEAD WITHOUT CONTRAST
TECHNIQUE: Contiguous axial images were obtained from the base of the skull
through the vertex without intravenous contrast.

[Series 2: head wo · axial · 0.40mm/px · z∈[-99,+11]mm · 9 of 27 slices shown, 12 images]
[im 3/27  brain]
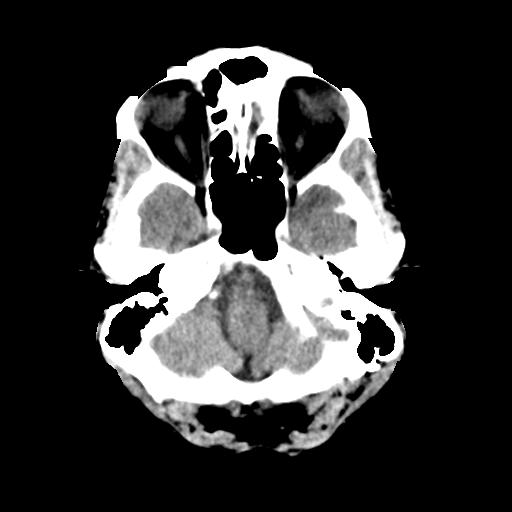
[im 3/27  bone]
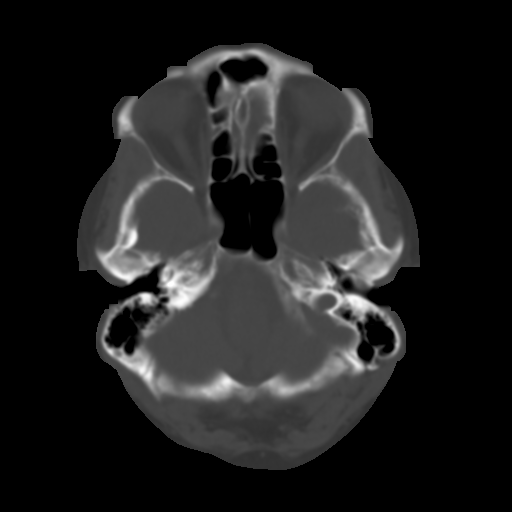
[im 6/27  brain]
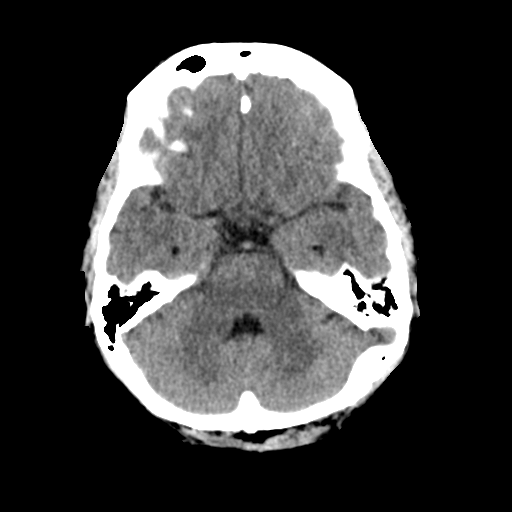
[im 8/27  brain]
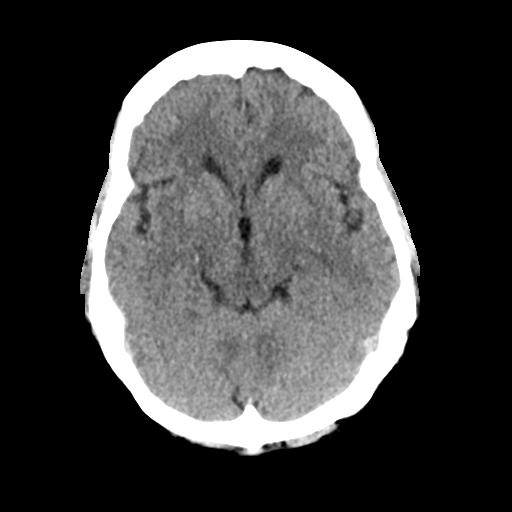
[im 11/27  brain]
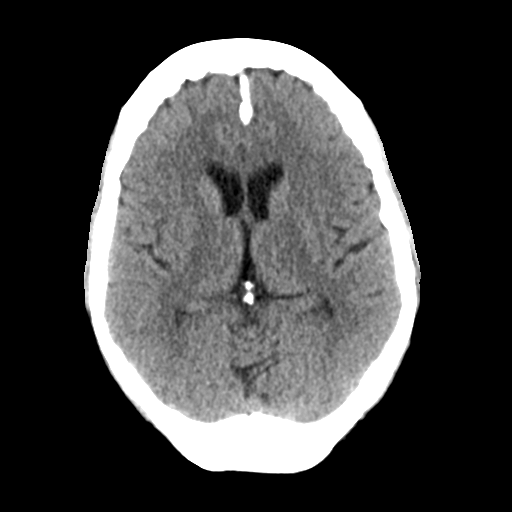
[im 14/27  brain]
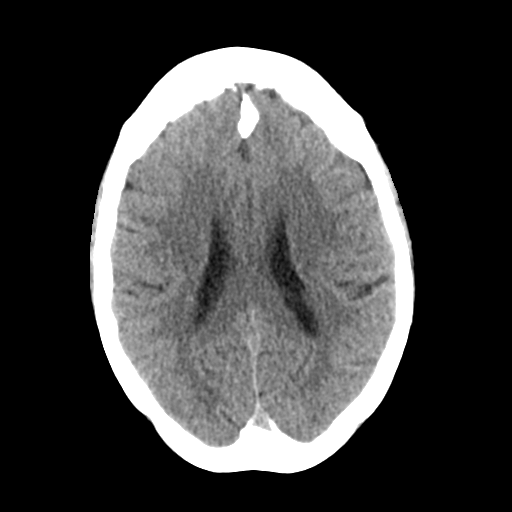
[im 14/27  bone]
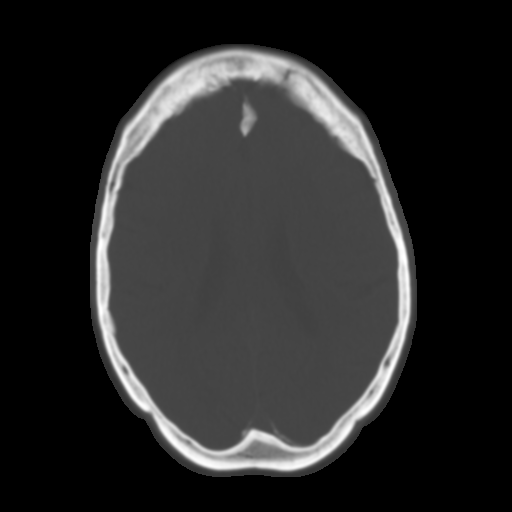
[im 17/27  brain]
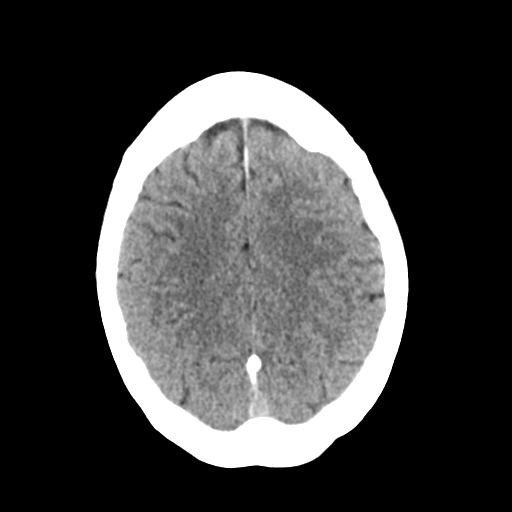
[im 20/27  brain]
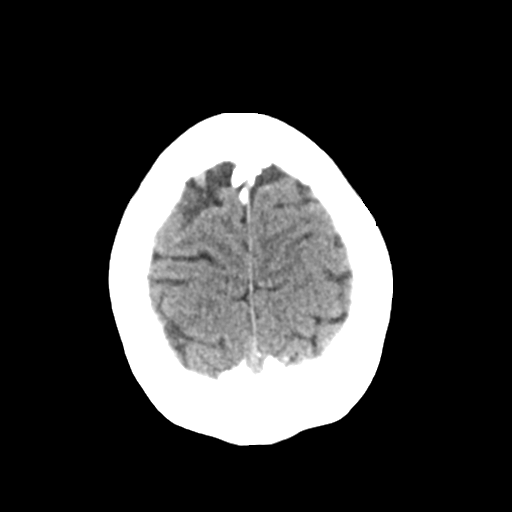
[im 22/27  brain]
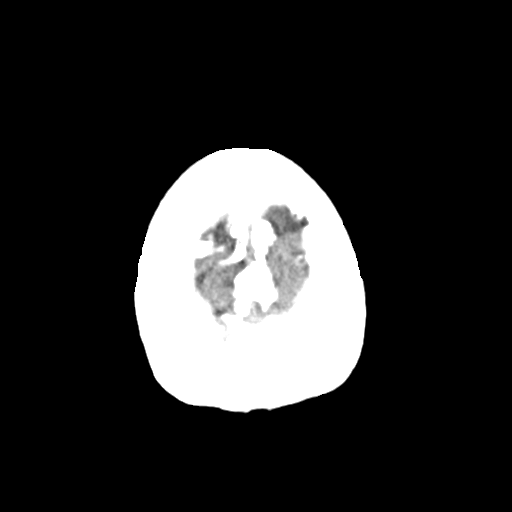
[im 25/27  brain]
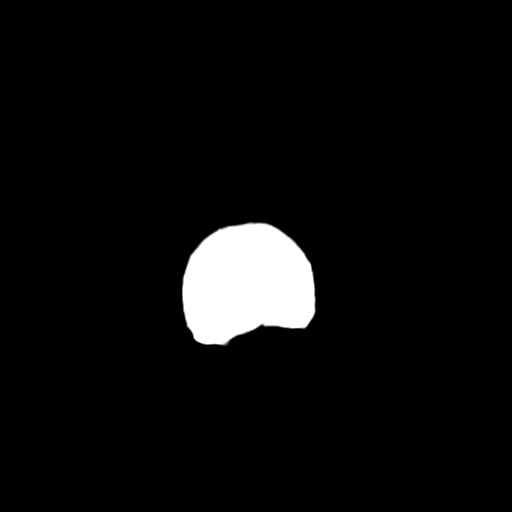
[im 25/27  bone]
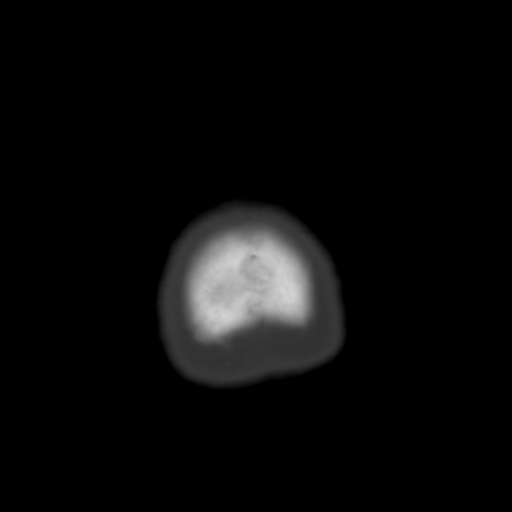

[Series 4: coronal soft tissue · coronal · 0.29mm/px · 3 of 61 slices shown]
[im 21/61  brain]
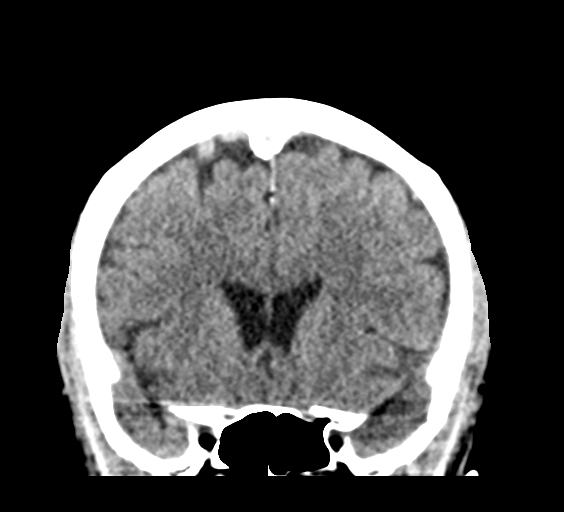
[im 27/61  brain]
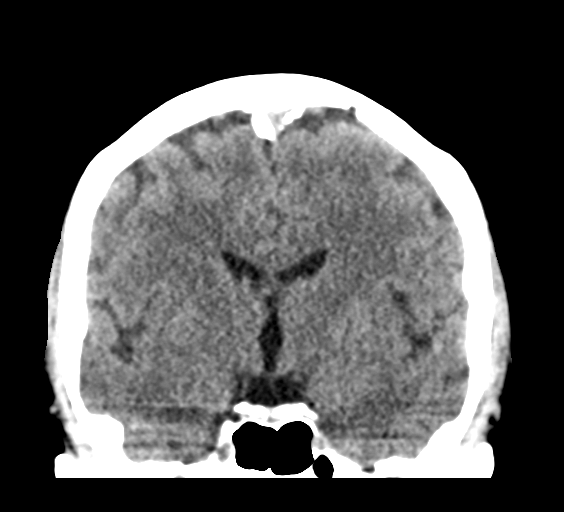
[im 34/61  brain]
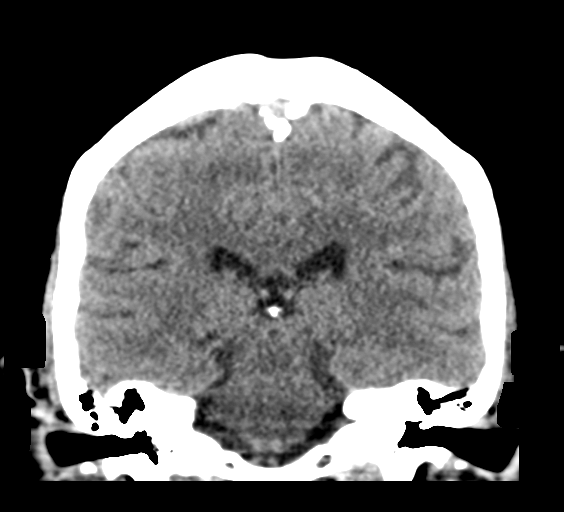

[Series 5: sagittal soft tissue · sagittal · 0.27mm/px · 3 of 48 slices shown]
[im 16/48  brain]
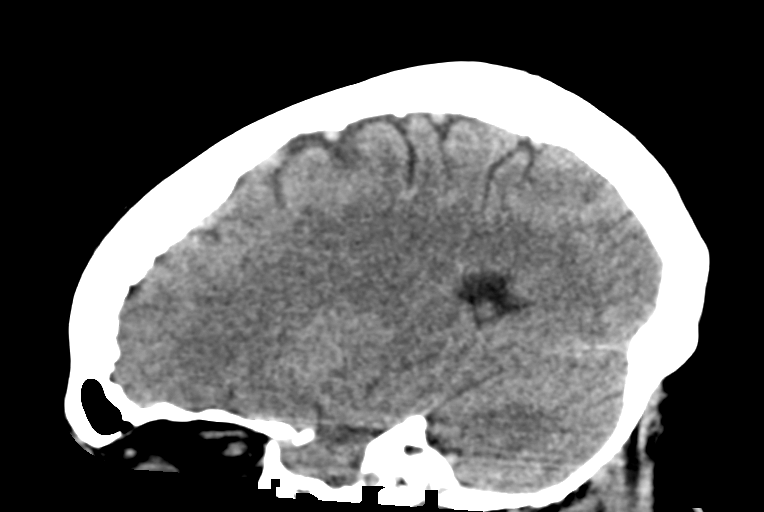
[im 24/48  brain]
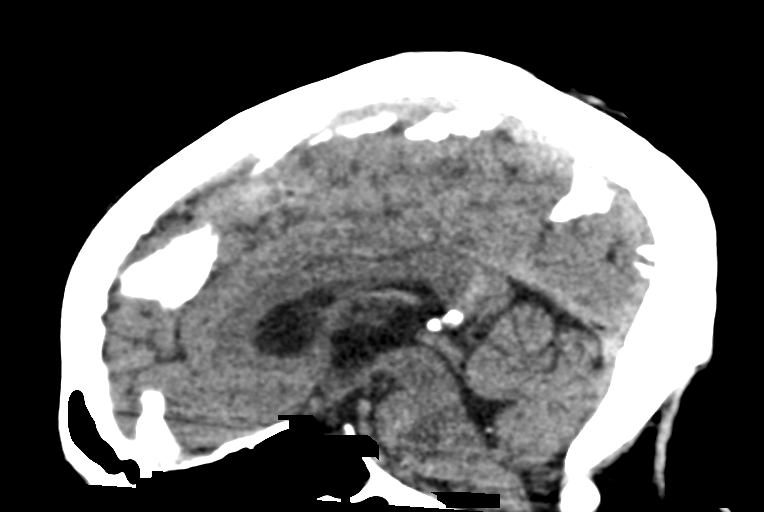
[im 32/48  brain]
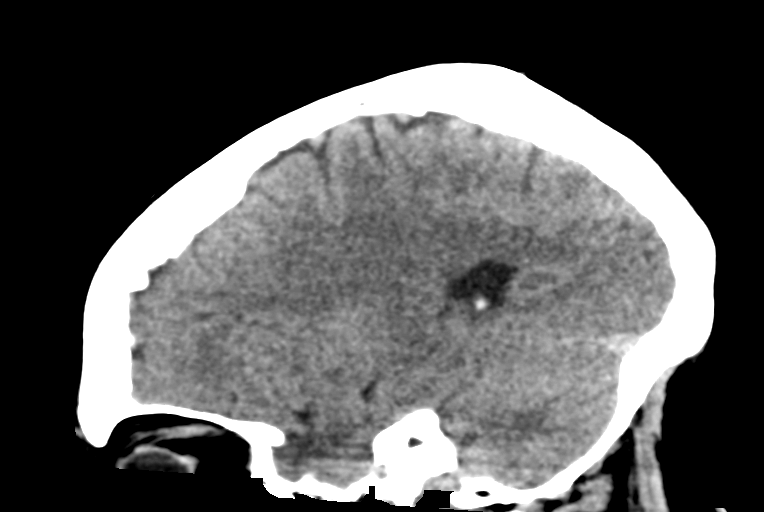

[15 of 44 positions shown; findings below may reference images not displayed]

FINDINGS: Brain: Ventricles, cisterns and other CSF spaces are normal. There
is no mass, mass effect, shift of midline structures or acute
hemorrhage. No evidence of acute infarction.

Vascular: No hyperdense vessel or unexpected calcification.

Skull: Normal. Negative for fracture or focal lesion.

Sinuses/Orbits: No acute finding.

Other: None.
IMPRESSION: No acute intracranial findings.

## 2018-02-12 DIAGNOSIS — G8929 Other chronic pain: Secondary | ICD-10-CM | POA: Insufficient documentation

## 2018-02-12 DIAGNOSIS — M79672 Pain in left foot: Secondary | ICD-10-CM | POA: Insufficient documentation

## 2018-02-17 DIAGNOSIS — E1169 Type 2 diabetes mellitus with other specified complication: Secondary | ICD-10-CM | POA: Insufficient documentation

## 2018-02-17 DIAGNOSIS — E785 Hyperlipidemia, unspecified: Secondary | ICD-10-CM | POA: Insufficient documentation

## 2018-05-10 ENCOUNTER — Emergency Department
Admission: EM | Admit: 2018-05-10 | Discharge: 2018-05-10 | Disposition: A | Discharge status: Home / Self Care | Payer: Medicaid Other | Attending: Emergency Medicine | Admitting: Emergency Medicine

## 2018-05-10 ENCOUNTER — Encounter: Payer: Self-pay | Admitting: Emergency Medicine

## 2018-05-10 ENCOUNTER — Other Ambulatory Visit: Payer: Self-pay

## 2018-05-10 DIAGNOSIS — R350 Frequency of micturition: Secondary | ICD-10-CM | POA: Insufficient documentation

## 2018-05-10 DIAGNOSIS — Y998 Other external cause status: Secondary | ICD-10-CM | POA: Diagnosis not present

## 2018-05-10 DIAGNOSIS — Z87891 Personal history of nicotine dependence: Secondary | ICD-10-CM | POA: Diagnosis not present

## 2018-05-10 DIAGNOSIS — I10 Essential (primary) hypertension: Secondary | ICD-10-CM | POA: Insufficient documentation

## 2018-05-10 DIAGNOSIS — R1030 Lower abdominal pain, unspecified: Secondary | ICD-10-CM | POA: Diagnosis not present

## 2018-05-10 DIAGNOSIS — Y9389 Activity, other specified: Secondary | ICD-10-CM | POA: Insufficient documentation

## 2018-05-10 DIAGNOSIS — W57XXXA Bitten or stung by nonvenomous insect and other nonvenomous arthropods, initial encounter: Secondary | ICD-10-CM | POA: Insufficient documentation

## 2018-05-10 DIAGNOSIS — Y929 Unspecified place or not applicable: Secondary | ICD-10-CM | POA: Diagnosis not present

## 2018-05-10 DIAGNOSIS — R51 Headache: Secondary | ICD-10-CM | POA: Diagnosis not present

## 2018-05-10 DIAGNOSIS — S70362A Insect bite (nonvenomous), left thigh, initial encounter: Secondary | ICD-10-CM | POA: Diagnosis present

## 2018-05-10 DIAGNOSIS — N3000 Acute cystitis without hematuria: Secondary | ICD-10-CM | POA: Insufficient documentation

## 2018-05-10 DIAGNOSIS — Z79899 Other long term (current) drug therapy: Secondary | ICD-10-CM | POA: Diagnosis not present

## 2018-05-10 DIAGNOSIS — R519 Headache, unspecified: Secondary | ICD-10-CM

## 2018-05-10 LAB — URINALYSIS, COMPLETE (UACMP) WITH MICROSCOPIC
BILIRUBIN URINE: NEGATIVE
Glucose, UA: 50 mg/dL — AB
Hgb urine dipstick: NEGATIVE
KETONES UR: NEGATIVE mg/dL
LEUKOCYTES UA: NEGATIVE
NITRITE: NEGATIVE
PROTEIN: NEGATIVE mg/dL
Specific Gravity, Urine: 1.006 (ref 1.005–1.030)
pH: 6 (ref 5.0–8.0)

## 2018-05-10 LAB — COMPREHENSIVE METABOLIC PANEL
ALK PHOS: 139 U/L — AB (ref 38–126)
ALT: 27 U/L (ref 0–44)
ANION GAP: 8 (ref 5–15)
AST: 27 U/L (ref 15–41)
Albumin: 4.1 g/dL (ref 3.5–5.0)
BILIRUBIN TOTAL: 0.6 mg/dL (ref 0.3–1.2)
BUN: 16 mg/dL (ref 8–23)
CALCIUM: 9.2 mg/dL (ref 8.9–10.3)
CO2: 29 mmol/L (ref 22–32)
Chloride: 103 mmol/L (ref 98–111)
Creatinine, Ser: 0.84 mg/dL (ref 0.44–1.00)
GFR calc Af Amer: >60 mL/min (ref >60)
GLUCOSE: 156 mg/dL — AB (ref 70–99)
Potassium: 4.4 mmol/L (ref 3.5–5.1)
Sodium: 140 mmol/L (ref 135–145)
TOTAL PROTEIN: 7.1 g/dL (ref 6.5–8.1)

## 2018-05-10 LAB — CBC
HEMATOCRIT: 36.2 % (ref 35.0–47.0)
HEMOGLOBIN: 12.8 g/dL (ref 12.0–16.0)
MCH: 27.1 pg (ref 26.0–34.0)
MCHC: 35.3 g/dL (ref 32.0–36.0)
MCV: 76.8 fL — ABNORMAL LOW (ref 80.0–100.0)
Platelets: 227 10*3/uL (ref 150–440)
RBC: 4.71 MIL/uL (ref 3.80–5.20)
RDW: 13.8 % (ref 11.5–14.5)
WBC: 7.8 10*3/uL (ref 3.6–11.0)

## 2018-05-10 LAB — LIPASE, BLOOD: Lipase: 32 U/L (ref 11–51)

## 2018-05-10 MED ORDER — DOXYCYCLINE HYCLATE 100 MG PO CAPS: 100.0000 mg | ORAL_CAPSULE | Freq: Two times a day (BID) | ORAL | 0 refills | Status: AC

## 2018-05-10 MED ORDER — DOXYCYCLINE HYCLATE 100 MG PO TABS
100.0000 mg | ORAL_TABLET | Freq: Once | ORAL | Status: AC
  Administered 2018-05-10: 100 mg via ORAL
  Filled 2018-05-10: qty 1

## 2018-05-10 MED ORDER — METOCLOPRAMIDE HCL 5 MG/ML IJ SOLN
10.0000 mg | Freq: Once | INTRAMUSCULAR | Status: AC
  Administered 2018-05-10: 10 mg via INTRAVENOUS
  Filled 2018-05-10: qty 2

## 2018-05-10 MED ORDER — ACETAMINOPHEN 500 MG PO TABS
1000.0000 mg | ORAL_TABLET | Freq: Once | ORAL | Status: AC
  Administered 2018-05-10: 1000 mg via ORAL
  Filled 2018-05-10: qty 2

## 2018-05-10 MED ORDER — SODIUM CHLORIDE 0.9 % IV BOLUS
1000.0000 mL | Freq: Once | INTRAVENOUS | Status: AC
  Administered 2018-05-10: 1000 mL via INTRAVENOUS

## 2018-05-10 NOTE — ED Provider Notes (Addendum)
[facility] Emergency Department Provider Note  ____________________________________________  Time seen: Approximately 4:57 PM  I have reviewed the triage vital signs and the nursing notes.   HISTORY  Chief Complaint Tick Removal and Abdominal Pain   HPI Jacqueline Dalton is a 67 y.o. female with a history of migraine headaches, hypertension, hyperlipidemia, arthritis who presents for evaluation of a tick bite.  Patient reports that 6 days ago she found a tick on her left thigh.  She removed it and 2 days later she started having daily headaches and intermittent lower abdominal cramping.  Her headaches currently is 7, frontal, dull and pressure-like.  She has been taking Tylenol which helps but the headache returns.  No thunderclap headache, no neck stiffness, no fever, no changes in vision, no nausea or vomiting.  No rash.  She is also complaining of intermittent lower cramping abdominal pain and increased urinary frequency.  She denies flank pain, right now she has no abdominal pain.  No prior abdominal surgeries.  Patient is worried that her symptoms are due to a tickborne illness and that is why she came to the emergency room to be evaluated.  Past Medical History:  Diagnosis Date  . Arthritis   . Hypercholesteremia   . Hypertension   . Migraine     Prior to Admission medications   Medication Sig Start Date End Date Taking? Authorizing Provider  amLODipine (NORVASC) 5 MG tablet Take 5 mg by mouth daily. 04/18/17   [provider]  atorvastatin (LIPITOR) 20 MG tablet Take 20 mg by mouth daily. 05/06/17   [provider]  benzonatate (TESSALON PERLES) 100 MG capsule Take 2 capsules (200 mg total) by mouth 3 (three) times daily as needed. 10/28/17 10/28/18  Joni ReiningSmith, Ronald K, PA-C  butalbital-acetaminophen-caffeine (FIORICET, ESGIC) [phone] MG tablet Take 1-2 tablets by mouth every 6 (six) hours as needed for headache. 04/22/17   [provider]  doxycycline (VIBRAMYCIN) 100 MG capsule Take 1 capsule (100 mg total) by mouth 2 (two) times daily for 7 days. 05/10/18 05/17/18  Nita SickleVeronese, [place], MD  fexofenadine-pseudoephedrine (ALLEGRA-D) 60-120 MG 12 hr tablet Take 1 tablet by mouth 2 (two) times daily. 10/28/17   Joni ReiningSmith, Ronald K, PA-C  ibuprofen (ADVIL,MOTRIN) 800 MG tablet Take 800 mg by mouth every 6 (six) hours as needed for pain. Take 800 mg by mouth every 6-8 hours as needed for arthritis. 03/23/17   [provider]  lovastatin (MEVACOR) 20 MG tablet Take 20 mg by mouth daily. 04/09/17   [provider]  nabumetone (RELAFEN) 750 MG tablet Take 750 mg by mouth 2 (two) times daily. 05/13/17   [provider]  naproxen (NAPROSYN) 500 MG tablet Take 1 tablet (500 mg total) by mouth 2 (two) times daily with a meal. 09/27/17   Joni ReiningSmith, Ronald K, PA-C  omeprazole (PRILOSEC) 20 MG capsule Take 20 mg by mouth daily. 03/23/17   [provider]  sulfamethoxazole-trimethoprim (BACTRIM DS,SEPTRA DS) 800-160 MG tablet Take 1 tablet by mouth 2 (two) times daily. 10/28/17   Joni ReiningSmith, Ronald K, PA-C  traMADol (ULTRAM) 50 MG tablet Take 50 mg by mouth every 6 (six) hours as needed for pain. 02/25/17   [provider]    Allergies Patient has no known allergies.  No family history on file.  Social History Social History   Tobacco Use  . Smoking status: Former Games developermoker  . Smokeless tobacco: Never Used  Substance Use Topics  . Alcohol use: No  .  Drug use: Not on file    Review of Systems  Constitutional: Negative for fever. Eyes: Negative for visual changes. ENT: Negative for sore throat. Neck: No neck pain  Cardiovascular: Negative for chest pain. Respiratory: Negative for shortness of breath. Gastrointestinal: + lower abdominal pain. No vomiting or diarrhea. Genitourinary: Negative for dysuria. + urinary frequency Musculoskeletal: Negative for back pain. Skin: Negative for rash. Neurological: +  headaches. No weakness or numbness. Psych: No SI or HI  ____________________________________________   PHYSICAL EXAM:  VITAL SIGNS: ED Triage Vitals  Enc Vitals Group     BP 05/10/18 1325 (!) 142/63     Pulse Rate 05/10/18 1325 (!) 105     Resp 05/10/18 1323 16     Temp 05/10/18 1323 99.2 F (37.3 C)     Temp Source 05/10/18 1323 Oral     SpO2 05/10/18 1325 95 %     Weight 05/10/18 1322 210 lb (95.3 kg)     Height 05/10/18 1322 5\' 4"  (1.626 m)     Head Circumference --      Peak Flow --      Pain Score 05/10/18 1322 7     Pain Loc --      Pain Edu? --      Excl. in GC? --     Constitutional: Alert and oriented. Well appearing and in no apparent distress. HEENT:      Head: Normocephalic and atraumatic.         Eyes: Conjunctivae are normal. Sclera is non-icteric.       Mouth/Throat: Mucous membranes are moist.       Neck: Supple with no signs of meningismus. Cardiovascular: Regular rate and rhythm. No murmurs, gallops, or rubs. 2+ symmetrical distal pulses are present in all extremities. No JVD. Respiratory: Normal respiratory effort. Lungs are clear to auscultation bilaterally. No wheezes, crackles, or rhonchi.  Gastrointestinal: Soft, mild suprapubic tenderness to palpation, and non distended with positive bowel sounds. No rebound or guarding. Musculoskeletal: Nontender with normal range of motion in all extremities. No edema, cyanosis, or erythema of extremities. Neurologic: Normal speech and language. Face is symmetric. Moving all extremities. No gross focal neurologic deficits are appreciated. Skin: Skin is warm, dry and intact. No rash noted. There is a small circular area where the tick was removed from with no erythema or abscess Psychiatric: Mood and affect are normal. Speech and behavior are normal.  ____________________________________________   LABS (all labs ordered are listed, but only abnormal results are displayed)  Labs Reviewed  COMPREHENSIVE METABOLIC  PANEL - Abnormal; Notable for the following components:      Result Value   Glucose, Bld 156 (*)    Alkaline Phosphatase 139 (*)    All other components within normal limits  CBC - Abnormal; Notable for the following components:   MCV 76.8 (*)    All other components within normal limits  URINALYSIS, COMPLETE (UACMP) WITH MICROSCOPIC - Abnormal; Notable for the following components:   Color, Urine STRAW (*)    APPearance CLEAR (*)    Glucose, UA 50 (*)    Bacteria, UA FEW (*)    All other components within normal limits  LIPASE, BLOOD   ____________________________________________  EKG  none ____________________________________________  RADIOLOGY  none  ____________________________________________   PROCEDURES  Procedure(s) performed: None Procedures Critical Care performed:  None ____________________________________________   INITIAL IMPRESSION / ASSESSMENT AND PLAN / ED COURSE  67 y.o. female with a history of migraine headaches, hypertension, hyperlipidemia,  arthritis who presents for evaluation of a tick bite.  # tick bite: Location of the bite does not seem infected.  Tick seems to be removed fully.  Patient has no rash, no meningeal signs, no fever.  However she does seem to have headaches for the last several days, therefore will start patient on doxycycline.  #HA: Patient has a history of chronic headaches, there is no evidence clinically of [facility] spotted fever, patient is neurologically intact, no signs of meningitis.  Will give Tylenol, Reglan and fluids.  #abdominal pain: Patient complaining of intermittent lower abdominal cramping, mild suprapubic tenderness, and urinary frequency.  UA with bacteria but no nitrites or leuks.  Since patient is symptomatic we will treat.  She is going to be put on doxycycline for tickborne illness which should treat UTI as well.  Urine culture is pending.  Labs are within normal limits.  No indication for imaging at this  time.  Clinically no signs or symptoms of appendicitis, diverticulitis, SBO.    _________________________ 6:10 PM on 05/10/2018 -----------------------------------------  She has headache has fully resolved after migraine cocktail.  She remains extremely well-appearing, serial abdominal exams showing no tenderness and no signs of surgical abdomen.  At this time patient is stable for discharge.  Discussed return precautions for any signs of tickborne illness, new or worsening abdominal pain, fever, severe headache.  Otherwise patient will follow-up with her primary care doctor.  Patient was given a 7-day course of doxycycline for potential tickborne illness.   As part of my medical decision making, I reviewed the following data within the electronic MEDICAL RECORD NUMBER Nursing notes reviewed and incorporated, Labs reviewed , Old chart reviewed, Notes from prior ED visits and [place] Controlled Substance Database    Pertinent labs & imaging results that were available during my care of the patient were reviewed by me and considered in my medical decision making (see chart for details).    ____________________________________________   FINAL CLINICAL IMPRESSION(S) / ED DIAGNOSES  Final diagnoses:  Tick bite, initial encounter  Acute cystitis without hematuria  Acute nonintractable headache, unspecified headache type      NEW MEDICATIONS STARTED DURING THIS VISIT:  ED Discharge Orders        Ordered    doxycycline (VIBRAMYCIN) 100 MG capsule  2 times daily     05/10/18 1808       Note:  This document was prepared using Dragon voice recognition software and may include unintentional dictation errors.    Don Perking, [place], MD 05/10/18 1811    Don Perking, [place], MD 05/10/18 [phone]

## 2018-05-10 NOTE — ED Notes (Signed)
Pt states that she was bitten by a tick on Sunday on her left thigh. Her symptoms include HA, nausea, swelling in her left hand, and weakness. Pt alert and oriented x 4.

## 2018-05-10 NOTE — Discharge Instructions (Signed)

## 2018-05-10 NOTE — ED Triage Notes (Addendum)
Patient states she had a tic bite to right leg last Sunday.  Arrives today with c/o right hand numbness since waking up this morning, abdominal pain and headache x 3 days.  Patient is AAOx3.  Skin warm and dry. NAD

## 2018-06-09 IMAGING — DX DG FOOT COMPLETE 3+V*L*
3 series · 3 of 3 positions shown · non-contrast
Comparison: None.

CLINICAL DATA: Left foot pain on dorsal surface since dropping
glass bottle on foot x2 months ago. Hx of arthritis. No previous
surgery to left foot.

EXAM:
LEFT FOOT - COMPLETE 3+ VIEW

[foot ap]
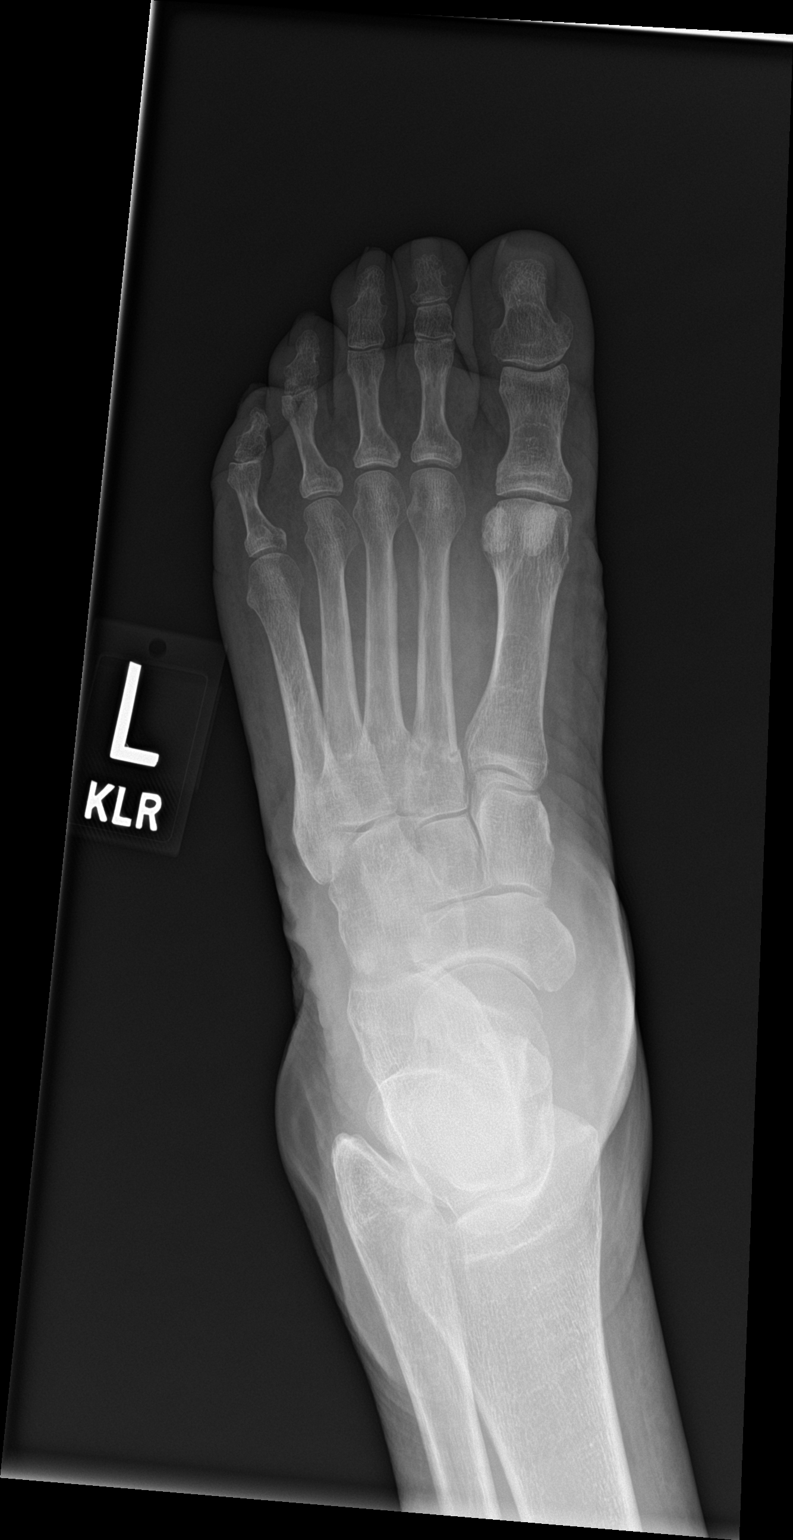

[foot obl]
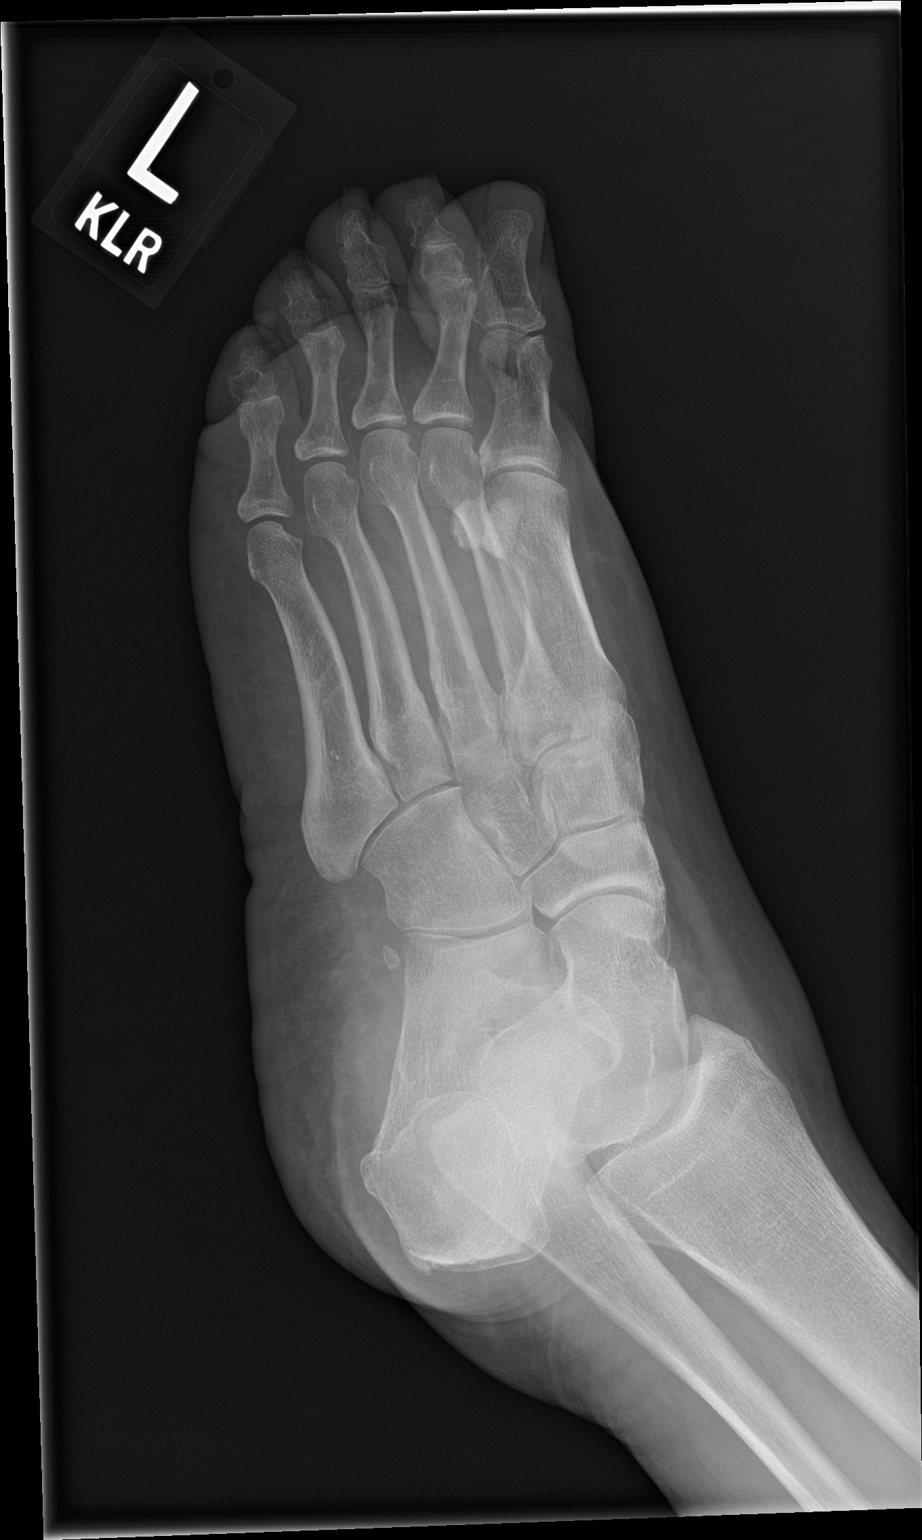

[foot lat]
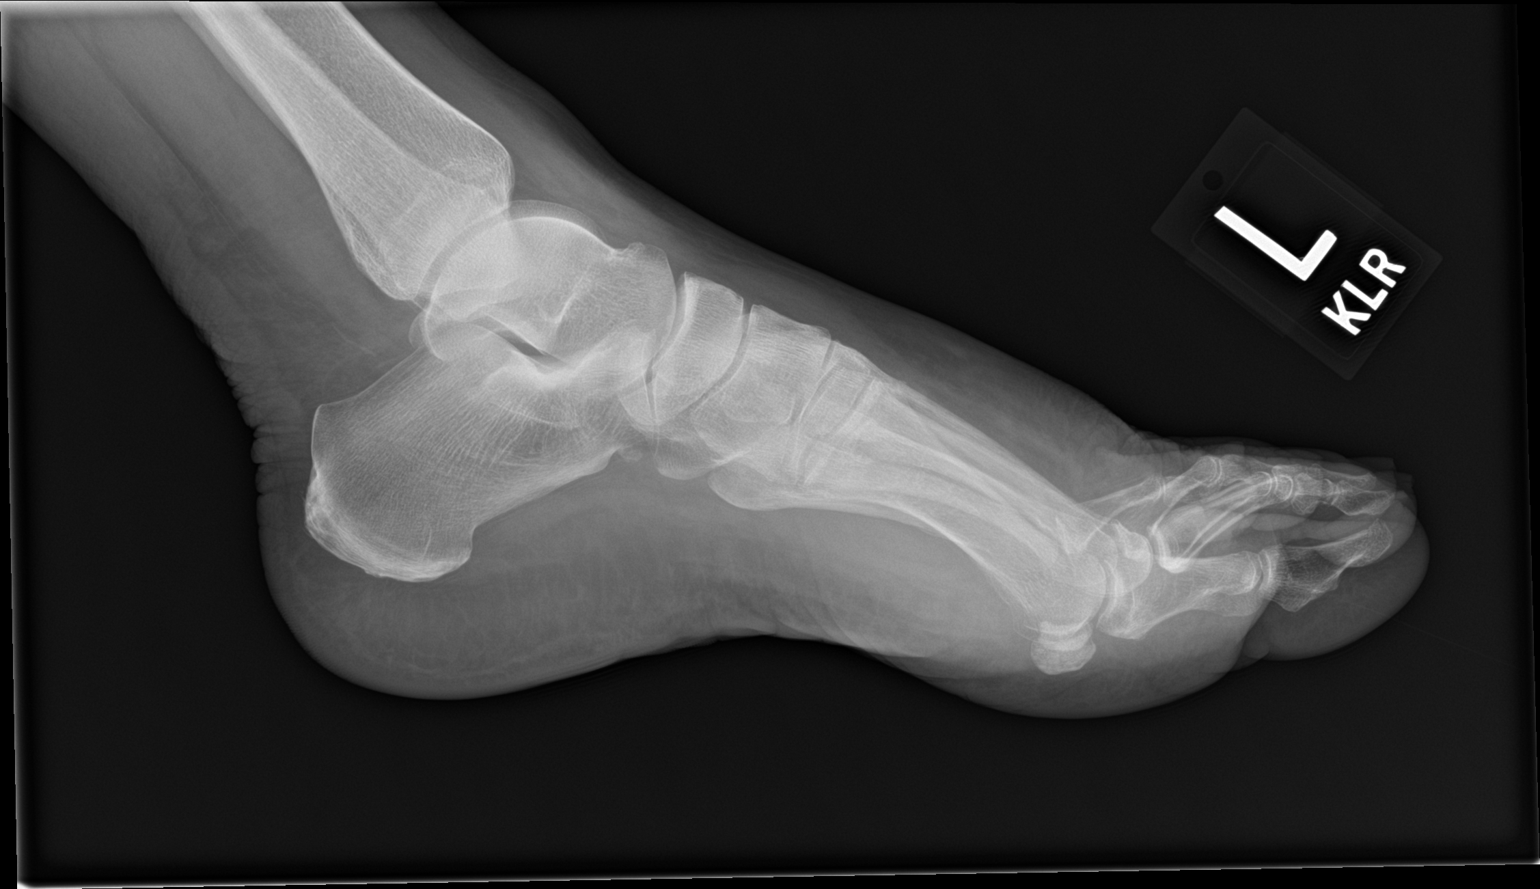

[3 of 3 positions shown; findings below may reference images not displayed]

FINDINGS: There is no evidence of fracture or dislocation. There is no
evidence of arthropathy or other focal bone abnormality. Soft tissue
swelling along the dorsal forefoot.
IMPRESSION: No acute osseous injury of the left foot.

## 2018-10-08 DIAGNOSIS — E119 Type 2 diabetes mellitus without complications: Secondary | ICD-10-CM | POA: Diagnosis not present

## 2018-10-08 DIAGNOSIS — I10 Essential (primary) hypertension: Secondary | ICD-10-CM | POA: Diagnosis not present

## 2018-11-14 DIAGNOSIS — E119 Type 2 diabetes mellitus without complications: Secondary | ICD-10-CM | POA: Diagnosis not present

## 2018-11-14 DIAGNOSIS — I10 Essential (primary) hypertension: Secondary | ICD-10-CM | POA: Diagnosis not present

## 2018-11-14 DIAGNOSIS — E7800 Pure hypercholesterolemia, unspecified: Secondary | ICD-10-CM | POA: Diagnosis not present

## 2018-11-21 DIAGNOSIS — E782 Mixed hyperlipidemia: Secondary | ICD-10-CM | POA: Diagnosis not present

## 2018-11-21 DIAGNOSIS — E785 Hyperlipidemia, unspecified: Secondary | ICD-10-CM | POA: Diagnosis not present

## 2018-11-21 DIAGNOSIS — E1169 Type 2 diabetes mellitus with other specified complication: Secondary | ICD-10-CM | POA: Diagnosis not present

## 2018-11-21 DIAGNOSIS — Z0000 Encounter for general adult medical examination without abnormal findings: Secondary | ICD-10-CM | POA: Insufficient documentation

## 2018-11-21 DIAGNOSIS — Z6835 Body mass index (BMI) 35.0-35.9, adult: Secondary | ICD-10-CM | POA: Diagnosis not present

## 2018-11-21 DIAGNOSIS — I10 Essential (primary) hypertension: Secondary | ICD-10-CM | POA: Diagnosis not present

## 2019-03-24 DIAGNOSIS — E785 Hyperlipidemia, unspecified: Secondary | ICD-10-CM | POA: Diagnosis not present

## 2019-03-24 DIAGNOSIS — E782 Mixed hyperlipidemia: Secondary | ICD-10-CM | POA: Diagnosis not present

## 2019-03-24 DIAGNOSIS — I10 Essential (primary) hypertension: Secondary | ICD-10-CM | POA: Diagnosis not present

## 2019-03-24 DIAGNOSIS — E1169 Type 2 diabetes mellitus with other specified complication: Secondary | ICD-10-CM | POA: Diagnosis not present

## 2019-03-31 DIAGNOSIS — E785 Hyperlipidemia, unspecified: Secondary | ICD-10-CM | POA: Diagnosis not present

## 2019-03-31 DIAGNOSIS — M25561 Pain in right knee: Secondary | ICD-10-CM | POA: Diagnosis not present

## 2019-03-31 DIAGNOSIS — E7800 Pure hypercholesterolemia, unspecified: Secondary | ICD-10-CM | POA: Diagnosis not present

## 2019-03-31 DIAGNOSIS — I10 Essential (primary) hypertension: Secondary | ICD-10-CM | POA: Diagnosis not present

## 2019-03-31 DIAGNOSIS — Z6835 Body mass index (BMI) 35.0-35.9, adult: Secondary | ICD-10-CM | POA: Diagnosis not present

## 2019-03-31 DIAGNOSIS — E1169 Type 2 diabetes mellitus with other specified complication: Secondary | ICD-10-CM | POA: Diagnosis not present

## 2019-06-02 DIAGNOSIS — R1030 Lower abdominal pain, unspecified: Secondary | ICD-10-CM | POA: Diagnosis not present

## 2019-06-02 DIAGNOSIS — R350 Frequency of micturition: Secondary | ICD-10-CM | POA: Diagnosis not present

## 2019-06-02 DIAGNOSIS — E119 Type 2 diabetes mellitus without complications: Secondary | ICD-10-CM | POA: Diagnosis not present

## 2019-07-29 DIAGNOSIS — E7800 Pure hypercholesterolemia, unspecified: Secondary | ICD-10-CM | POA: Diagnosis not present

## 2019-07-29 DIAGNOSIS — E1169 Type 2 diabetes mellitus with other specified complication: Secondary | ICD-10-CM | POA: Diagnosis not present

## 2019-07-29 DIAGNOSIS — E785 Hyperlipidemia, unspecified: Secondary | ICD-10-CM | POA: Diagnosis not present

## 2019-07-29 DIAGNOSIS — I10 Essential (primary) hypertension: Secondary | ICD-10-CM | POA: Diagnosis not present

## 2019-08-05 DIAGNOSIS — E7800 Pure hypercholesterolemia, unspecified: Secondary | ICD-10-CM | POA: Diagnosis not present

## 2019-08-05 DIAGNOSIS — I10 Essential (primary) hypertension: Secondary | ICD-10-CM | POA: Diagnosis not present

## 2019-08-05 DIAGNOSIS — R1031 Right lower quadrant pain: Secondary | ICD-10-CM | POA: Diagnosis not present

## 2019-08-05 DIAGNOSIS — E1169 Type 2 diabetes mellitus with other specified complication: Secondary | ICD-10-CM | POA: Diagnosis not present

## 2019-08-05 DIAGNOSIS — Z6834 Body mass index (BMI) 34.0-34.9, adult: Secondary | ICD-10-CM | POA: Diagnosis not present

## 2019-08-05 DIAGNOSIS — E785 Hyperlipidemia, unspecified: Secondary | ICD-10-CM | POA: Diagnosis not present

## 2019-08-05 DIAGNOSIS — R1032 Left lower quadrant pain: Secondary | ICD-10-CM | POA: Diagnosis not present

## 2019-08-05 DIAGNOSIS — E6609 Other obesity due to excess calories: Secondary | ICD-10-CM | POA: Diagnosis not present

## 2019-08-05 DIAGNOSIS — Z87891 Personal history of nicotine dependence: Secondary | ICD-10-CM | POA: Diagnosis not present

## 2019-08-20 ENCOUNTER — Ambulatory Visit: Payer: Self-pay | Admitting: Nurse Practitioner

## 2019-08-20 DIAGNOSIS — E1165 Type 2 diabetes mellitus with hyperglycemia: Secondary | ICD-10-CM | POA: Insufficient documentation

## 2019-08-20 DIAGNOSIS — IMO0002 Reserved for concepts with insufficient information to code with codable children: Secondary | ICD-10-CM | POA: Insufficient documentation

## 2019-08-24 ENCOUNTER — Ambulatory Visit: Payer: Self-pay | Admitting: Nurse Practitioner

## 2019-10-01 DIAGNOSIS — I10 Essential (primary) hypertension: Secondary | ICD-10-CM | POA: Diagnosis not present

## 2019-10-01 DIAGNOSIS — E1165 Type 2 diabetes mellitus with hyperglycemia: Secondary | ICD-10-CM | POA: Diagnosis not present

## 2019-10-01 DIAGNOSIS — E669 Obesity, unspecified: Secondary | ICD-10-CM | POA: Diagnosis not present

## 2019-10-01 DIAGNOSIS — E782 Mixed hyperlipidemia: Secondary | ICD-10-CM | POA: Diagnosis not present

## 2019-11-16 DIAGNOSIS — E782 Mixed hyperlipidemia: Secondary | ICD-10-CM | POA: Diagnosis not present

## 2019-11-16 DIAGNOSIS — I10 Essential (primary) hypertension: Secondary | ICD-10-CM | POA: Diagnosis not present

## 2019-11-16 DIAGNOSIS — E669 Obesity, unspecified: Secondary | ICD-10-CM | POA: Diagnosis not present

## 2019-11-16 DIAGNOSIS — E1165 Type 2 diabetes mellitus with hyperglycemia: Secondary | ICD-10-CM | POA: Diagnosis not present

## 2019-12-11 DIAGNOSIS — R0689 Other abnormalities of breathing: Secondary | ICD-10-CM | POA: Diagnosis not present

## 2019-12-11 DIAGNOSIS — Z0000 Encounter for general adult medical examination without abnormal findings: Secondary | ICD-10-CM | POA: Diagnosis not present

## 2019-12-11 DIAGNOSIS — R0989 Other specified symptoms and signs involving the circulatory and respiratory systems: Secondary | ICD-10-CM | POA: Diagnosis not present

## 2019-12-11 DIAGNOSIS — E669 Obesity, unspecified: Secondary | ICD-10-CM | POA: Diagnosis not present

## 2019-12-11 DIAGNOSIS — E119 Type 2 diabetes mellitus without complications: Secondary | ICD-10-CM | POA: Diagnosis not present

## 2019-12-11 DIAGNOSIS — Z87891 Personal history of nicotine dependence: Secondary | ICD-10-CM | POA: Diagnosis not present

## 2019-12-11 DIAGNOSIS — Z6834 Body mass index (BMI) 34.0-34.9, adult: Secondary | ICD-10-CM | POA: Diagnosis not present

## 2019-12-14 DIAGNOSIS — E785 Hyperlipidemia, unspecified: Secondary | ICD-10-CM | POA: Diagnosis not present

## 2019-12-14 DIAGNOSIS — E1169 Type 2 diabetes mellitus with other specified complication: Secondary | ICD-10-CM | POA: Diagnosis not present

## 2020-02-12 DIAGNOSIS — E6609 Other obesity due to excess calories: Secondary | ICD-10-CM | POA: Diagnosis not present

## 2020-02-12 DIAGNOSIS — E785 Hyperlipidemia, unspecified: Secondary | ICD-10-CM | POA: Diagnosis not present

## 2020-02-12 DIAGNOSIS — Z01812 Encounter for preprocedural laboratory examination: Secondary | ICD-10-CM | POA: Diagnosis not present

## 2020-02-12 DIAGNOSIS — Z6834 Body mass index (BMI) 34.0-34.9, adult: Secondary | ICD-10-CM | POA: Diagnosis not present

## 2020-02-12 DIAGNOSIS — R1033 Periumbilical pain: Secondary | ICD-10-CM | POA: Diagnosis not present

## 2020-02-12 DIAGNOSIS — Z1211 Encounter for screening for malignant neoplasm of colon: Secondary | ICD-10-CM | POA: Diagnosis not present

## 2020-02-12 DIAGNOSIS — E1169 Type 2 diabetes mellitus with other specified complication: Secondary | ICD-10-CM | POA: Diagnosis not present

## 2020-02-17 DIAGNOSIS — E782 Mixed hyperlipidemia: Secondary | ICD-10-CM | POA: Diagnosis not present

## 2020-02-17 DIAGNOSIS — E1165 Type 2 diabetes mellitus with hyperglycemia: Secondary | ICD-10-CM | POA: Diagnosis not present

## 2020-02-17 DIAGNOSIS — E669 Obesity, unspecified: Secondary | ICD-10-CM | POA: Diagnosis not present

## 2020-02-17 DIAGNOSIS — I10 Essential (primary) hypertension: Secondary | ICD-10-CM | POA: Diagnosis not present

## 2020-04-06 DIAGNOSIS — E1169 Type 2 diabetes mellitus with other specified complication: Secondary | ICD-10-CM | POA: Diagnosis not present

## 2020-04-06 DIAGNOSIS — I10 Essential (primary) hypertension: Secondary | ICD-10-CM | POA: Diagnosis not present

## 2020-04-06 DIAGNOSIS — N760 Acute vaginitis: Secondary | ICD-10-CM | POA: Diagnosis not present

## 2020-04-06 DIAGNOSIS — E785 Hyperlipidemia, unspecified: Secondary | ICD-10-CM | POA: Diagnosis not present

## 2020-04-06 DIAGNOSIS — E7800 Pure hypercholesterolemia, unspecified: Secondary | ICD-10-CM | POA: Diagnosis not present

## 2020-04-06 DIAGNOSIS — R300 Dysuria: Secondary | ICD-10-CM | POA: Diagnosis not present

## 2020-04-11 DIAGNOSIS — Z6836 Body mass index (BMI) 36.0-36.9, adult: Secondary | ICD-10-CM | POA: Diagnosis not present

## 2020-04-11 DIAGNOSIS — I10 Essential (primary) hypertension: Secondary | ICD-10-CM | POA: Diagnosis not present

## 2020-04-11 DIAGNOSIS — E785 Hyperlipidemia, unspecified: Secondary | ICD-10-CM | POA: Diagnosis not present

## 2020-04-11 DIAGNOSIS — E1169 Type 2 diabetes mellitus with other specified complication: Secondary | ICD-10-CM | POA: Diagnosis not present

## 2020-04-28 DIAGNOSIS — Z01812 Encounter for preprocedural laboratory examination: Secondary | ICD-10-CM | POA: Diagnosis not present

## 2020-05-03 DIAGNOSIS — Z1211 Encounter for screening for malignant neoplasm of colon: Secondary | ICD-10-CM | POA: Diagnosis not present

## 2020-05-03 DIAGNOSIS — E119 Type 2 diabetes mellitus without complications: Secondary | ICD-10-CM | POA: Diagnosis not present

## 2020-05-03 DIAGNOSIS — K640 First degree hemorrhoids: Secondary | ICD-10-CM | POA: Diagnosis not present

## 2020-05-03 DIAGNOSIS — I10 Essential (primary) hypertension: Secondary | ICD-10-CM | POA: Diagnosis not present

## 2020-05-03 DIAGNOSIS — K6289 Other specified diseases of anus and rectum: Secondary | ICD-10-CM | POA: Diagnosis not present

## 2020-05-03 DIAGNOSIS — G43909 Migraine, unspecified, not intractable, without status migrainosus: Secondary | ICD-10-CM | POA: Diagnosis not present

## 2020-05-03 DIAGNOSIS — K621 Rectal polyp: Secondary | ICD-10-CM | POA: Diagnosis not present

## 2020-05-26 DIAGNOSIS — E785 Hyperlipidemia, unspecified: Secondary | ICD-10-CM | POA: Diagnosis not present

## 2020-05-26 DIAGNOSIS — E782 Mixed hyperlipidemia: Secondary | ICD-10-CM | POA: Diagnosis not present

## 2020-05-26 DIAGNOSIS — E1169 Type 2 diabetes mellitus with other specified complication: Secondary | ICD-10-CM | POA: Diagnosis not present

## 2020-05-26 DIAGNOSIS — I10 Essential (primary) hypertension: Secondary | ICD-10-CM | POA: Diagnosis not present

## 2020-05-26 DIAGNOSIS — E669 Obesity, unspecified: Secondary | ICD-10-CM | POA: Diagnosis not present

## 2020-05-26 DIAGNOSIS — E119 Type 2 diabetes mellitus without complications: Secondary | ICD-10-CM | POA: Diagnosis not present

## 2020-08-29 DIAGNOSIS — E782 Mixed hyperlipidemia: Secondary | ICD-10-CM | POA: Diagnosis not present

## 2020-08-29 DIAGNOSIS — E785 Hyperlipidemia, unspecified: Secondary | ICD-10-CM | POA: Diagnosis not present

## 2020-08-29 DIAGNOSIS — I10 Essential (primary) hypertension: Secondary | ICD-10-CM | POA: Diagnosis not present

## 2020-08-29 DIAGNOSIS — E1169 Type 2 diabetes mellitus with other specified complication: Secondary | ICD-10-CM | POA: Diagnosis not present

## 2020-08-29 DIAGNOSIS — E669 Obesity, unspecified: Secondary | ICD-10-CM | POA: Diagnosis not present

## 2020-11-03 DIAGNOSIS — Z6835 Body mass index (BMI) 35.0-35.9, adult: Secondary | ICD-10-CM | POA: Diagnosis not present

## 2020-11-03 DIAGNOSIS — E1169 Type 2 diabetes mellitus with other specified complication: Secondary | ICD-10-CM | POA: Diagnosis not present

## 2020-11-03 DIAGNOSIS — M79601 Pain in right arm: Secondary | ICD-10-CM | POA: Diagnosis not present

## 2020-11-03 DIAGNOSIS — I10 Essential (primary) hypertension: Secondary | ICD-10-CM | POA: Diagnosis not present

## 2020-11-03 DIAGNOSIS — E785 Hyperlipidemia, unspecified: Secondary | ICD-10-CM | POA: Diagnosis not present

## 2020-11-10 DIAGNOSIS — I10 Essential (primary) hypertension: Secondary | ICD-10-CM | POA: Diagnosis not present

## 2020-11-10 DIAGNOSIS — E1169 Type 2 diabetes mellitus with other specified complication: Secondary | ICD-10-CM | POA: Diagnosis not present

## 2020-11-10 DIAGNOSIS — E785 Hyperlipidemia, unspecified: Secondary | ICD-10-CM | POA: Diagnosis not present

## 2021-01-24 DIAGNOSIS — H353131 Nonexudative age-related macular degeneration, bilateral, early dry stage: Secondary | ICD-10-CM | POA: Diagnosis not present

## 2021-01-24 DIAGNOSIS — E119 Type 2 diabetes mellitus without complications: Secondary | ICD-10-CM | POA: Diagnosis not present

## 2021-01-24 DIAGNOSIS — H2513 Age-related nuclear cataract, bilateral: Secondary | ICD-10-CM | POA: Diagnosis not present

## 2021-02-09 DIAGNOSIS — I10 Essential (primary) hypertension: Secondary | ICD-10-CM | POA: Diagnosis not present

## 2021-02-09 DIAGNOSIS — E782 Mixed hyperlipidemia: Secondary | ICD-10-CM | POA: Diagnosis not present

## 2021-02-09 DIAGNOSIS — E669 Obesity, unspecified: Secondary | ICD-10-CM | POA: Diagnosis not present

## 2021-02-09 DIAGNOSIS — E1165 Type 2 diabetes mellitus with hyperglycemia: Secondary | ICD-10-CM | POA: Diagnosis not present

## 2021-05-22 DIAGNOSIS — I10 Essential (primary) hypertension: Secondary | ICD-10-CM | POA: Diagnosis not present

## 2021-05-22 DIAGNOSIS — E785 Hyperlipidemia, unspecified: Secondary | ICD-10-CM | POA: Diagnosis not present

## 2021-05-22 DIAGNOSIS — E1165 Type 2 diabetes mellitus with hyperglycemia: Secondary | ICD-10-CM | POA: Diagnosis not present

## 2021-05-22 DIAGNOSIS — Z6835 Body mass index (BMI) 35.0-35.9, adult: Secondary | ICD-10-CM | POA: Diagnosis not present

## 2021-05-22 DIAGNOSIS — E782 Mixed hyperlipidemia: Secondary | ICD-10-CM | POA: Diagnosis not present

## 2021-05-22 DIAGNOSIS — E1169 Type 2 diabetes mellitus with other specified complication: Secondary | ICD-10-CM | POA: Diagnosis not present

## 2021-05-25 DIAGNOSIS — E785 Hyperlipidemia, unspecified: Secondary | ICD-10-CM | POA: Diagnosis not present

## 2021-05-25 DIAGNOSIS — Z0000 Encounter for general adult medical examination without abnormal findings: Secondary | ICD-10-CM | POA: Diagnosis not present

## 2021-05-25 DIAGNOSIS — Z6833 Body mass index (BMI) 33.0-33.9, adult: Secondary | ICD-10-CM | POA: Diagnosis not present

## 2021-05-25 DIAGNOSIS — I10 Essential (primary) hypertension: Secondary | ICD-10-CM | POA: Diagnosis not present

## 2021-05-25 DIAGNOSIS — E669 Obesity, unspecified: Secondary | ICD-10-CM | POA: Diagnosis not present

## 2021-05-25 DIAGNOSIS — E119 Type 2 diabetes mellitus without complications: Secondary | ICD-10-CM | POA: Diagnosis not present

## 2021-05-31 DIAGNOSIS — M545 Low back pain, unspecified: Secondary | ICD-10-CM | POA: Diagnosis not present

## 2021-05-31 DIAGNOSIS — R350 Frequency of micturition: Secondary | ICD-10-CM | POA: Diagnosis not present

## 2021-05-31 DIAGNOSIS — R1030 Lower abdominal pain, unspecified: Secondary | ICD-10-CM | POA: Diagnosis not present
